# Patient Record
Sex: Male | Born: 1960
Health system: Southern US, Community
[De-identification: ages and names within clinical notes are randomized; demographics above are authoritative.]

## PROBLEM LIST (undated history)

## (undated) DIAGNOSIS — K219 Gastro-esophageal reflux disease without esophagitis: Secondary | ICD-10-CM

## (undated) DIAGNOSIS — E785 Hyperlipidemia, unspecified: Secondary | ICD-10-CM

---

## 2001-09-14 ENCOUNTER — Emergency Department (HOSPITAL_COMMUNITY): Admission: EM | Admit: 2001-09-14 | Discharge: 2001-09-14 | Payer: Self-pay | Admitting: Emergency Medicine

## 2001-09-14 ENCOUNTER — Encounter: Payer: Self-pay | Admitting: Emergency Medicine

## 2009-02-23 ENCOUNTER — Encounter: Admission: RE | Admit: 2009-02-23 | Discharge: 2009-02-23 | Payer: Self-pay | Admitting: Family Medicine

## 2014-05-25 ENCOUNTER — Emergency Department (HOSPITAL_COMMUNITY)
Admission: EM | Admit: 2014-05-25 | Discharge: 2014-05-25 | Disposition: A | Discharge status: Nursing Home (Includes ICF) | Payer: BC Managed Care – PPO | Source: Home / Self Care | Attending: Emergency Medicine | Admitting: Emergency Medicine

## 2014-05-25 ENCOUNTER — Observation Stay (HOSPITAL_COMMUNITY)
Admission: EM | Admit: 2014-05-25 | Discharge: 2014-05-27 | Disposition: A | Discharge status: Home / Self Care | Payer: BC Managed Care – PPO | Attending: General Surgery | Admitting: General Surgery

## 2014-05-25 ENCOUNTER — Encounter (HOSPITAL_COMMUNITY): Payer: Self-pay

## 2014-05-25 ENCOUNTER — Encounter (HOSPITAL_COMMUNITY): Payer: Self-pay | Admitting: Emergency Medicine

## 2014-05-25 DIAGNOSIS — Z7982 Long term (current) use of aspirin: Secondary | ICD-10-CM | POA: Diagnosis not present

## 2014-05-25 DIAGNOSIS — K3580 Unspecified acute appendicitis: Secondary | ICD-10-CM | POA: Diagnosis not present

## 2014-05-25 DIAGNOSIS — E785 Hyperlipidemia, unspecified: Secondary | ICD-10-CM | POA: Diagnosis not present

## 2014-05-25 DIAGNOSIS — K219 Gastro-esophageal reflux disease without esophagitis: Secondary | ICD-10-CM | POA: Insufficient documentation

## 2014-05-25 DIAGNOSIS — R1031 Right lower quadrant pain: Secondary | ICD-10-CM

## 2014-05-25 DIAGNOSIS — R109 Unspecified abdominal pain: Secondary | ICD-10-CM

## 2014-05-25 DIAGNOSIS — Z79899 Other long term (current) drug therapy: Secondary | ICD-10-CM | POA: Insufficient documentation

## 2014-05-25 HISTORY — DX: Hyperlipidemia, unspecified: E78.5

## 2014-05-25 HISTORY — DX: Gastro-esophageal reflux disease without esophagitis: K21.9

## 2014-05-25 LAB — COMPREHENSIVE METABOLIC PANEL
ALBUMIN: 4.3 g/dL (ref 3.5–5.2)
ALK PHOS: 56 U/L (ref 39–117)
ALT: 57 U/L — AB (ref 0–53)
ANION GAP: 15 (ref 5–15)
AST: 35 U/L (ref 0–37)
BUN: 12 mg/dL (ref 6–23)
CO2: 26 mEq/L (ref 19–32)
Calcium: 10 mg/dL (ref 8.4–10.5)
Chloride: 99 mEq/L (ref 96–112)
Creatinine, Ser: 1.03 mg/dL (ref 0.50–1.35)
GFR calc Af Amer: >90 mL/min (ref >90)
GFR calc non Af Amer: 82 mL/min — ABNORMAL LOW (ref >90)
Glucose, Bld: 140 mg/dL — ABNORMAL HIGH (ref 70–99)
POTASSIUM: 4 meq/L (ref 3.7–5.3)
SODIUM: 140 meq/L (ref 137–147)
TOTAL PROTEIN: 7.7 g/dL (ref 6.0–8.3)
Total Bilirubin: 0.8 mg/dL (ref 0.3–1.2)

## 2014-05-25 LAB — CBC WITH DIFFERENTIAL/PLATELET
BASOS PCT: 0 % (ref 0–1)
Basophils Absolute: 0 10*3/uL (ref 0.0–0.1)
Eosinophils Absolute: 0 10*3/uL (ref 0.0–0.7)
Eosinophils Relative: 0 % (ref 0–5)
HCT: 43.1 % (ref 39.0–52.0)
Hemoglobin: 15.3 g/dL (ref 13.0–17.0)
Lymphocytes Relative: 8 % — ABNORMAL LOW (ref 12–46)
Lymphs Abs: 1.1 10*3/uL (ref 0.7–4.0)
MCH: 31.4 pg (ref 26.0–34.0)
MCHC: 35.5 g/dL (ref 30.0–36.0)
MCV: 88.5 fL (ref 78.0–100.0)
Monocytes Absolute: 0.6 10*3/uL (ref 0.1–1.0)
Monocytes Relative: 4 % (ref 3–12)
NEUTROS ABS: 12.7 10*3/uL — AB (ref 1.7–7.7)
NEUTROS PCT: 88 % — AB (ref 43–77)
Platelets: 178 10*3/uL (ref 150–400)
RBC: 4.87 MIL/uL (ref 4.22–5.81)
RDW: 12.6 % (ref 11.5–15.5)
WBC: 14.5 10*3/uL — ABNORMAL HIGH (ref 4.0–10.5)

## 2014-05-25 LAB — URINALYSIS, ROUTINE W REFLEX MICROSCOPIC
Glucose, UA: NEGATIVE mg/dL
HGB URINE DIPSTICK: NEGATIVE
Ketones, ur: 15 mg/dL — AB
Leukocytes, UA: NEGATIVE
Nitrite: NEGATIVE
PH: 5 (ref 5.0–8.0)
Protein, ur: NEGATIVE mg/dL
Specific Gravity, Urine: 1.022 (ref 1.005–1.030)
UROBILINOGEN UA: 0.2 mg/dL (ref 0.0–1.0)

## 2014-05-25 LAB — LIPASE, BLOOD: Lipase: 113 U/L — ABNORMAL HIGH (ref 11–59)

## 2014-05-25 MED ORDER — MORPHINE SULFATE 4 MG/ML IJ SOLN
4.0000 mg | Freq: Once | INTRAMUSCULAR | Status: AC
  Administered 2014-05-25: 4 mg via INTRAVENOUS
  Filled 2014-05-25: qty 1

## 2014-05-25 MED ORDER — HYDROMORPHONE HCL 1 MG/ML IJ SOLN
2.0000 mg | Freq: Once | INTRAMUSCULAR | Status: AC
  Administered 2014-05-25: 2 mg via INTRAMUSCULAR

## 2014-05-25 MED ORDER — SODIUM CHLORIDE 0.9 % IV BOLUS (SEPSIS)
1000.0000 mL | Freq: Once | INTRAVENOUS | Status: AC
  Administered 2014-05-25: 1000 mL via INTRAVENOUS

## 2014-05-25 MED ORDER — ONDANSETRON HCL 4 MG/2ML IJ SOLN
INTRAMUSCULAR | Status: AC
  Filled 2014-05-25: qty 2

## 2014-05-25 MED ORDER — HYDROMORPHONE HCL 1 MG/ML IJ SOLN
INTRAMUSCULAR | Status: AC
  Filled 2014-05-25: qty 2

## 2014-05-25 MED ORDER — SODIUM CHLORIDE 0.9 % IV SOLN
INTRAVENOUS | Status: DC
  Administered 2014-05-25: 21:00:00 via INTRAVENOUS

## 2014-05-25 MED ORDER — ONDANSETRON HCL 4 MG/2ML IJ SOLN
4.0000 mg | Freq: Once | INTRAMUSCULAR | Status: AC
Start: 2014-05-25 — End: 2014-05-25
  Administered 2014-05-25: 4 mg via INTRAMUSCULAR

## 2014-05-25 MED ORDER — HYDROMORPHONE HCL 1 MG/ML IJ SOLN
1.0000 mg | Freq: Once | INTRAMUSCULAR | Status: AC
  Administered 2014-05-25: 1 mg via INTRAVENOUS
  Filled 2014-05-25: qty 1

## 2014-05-25 MED ORDER — ONDANSETRON HCL 4 MG/2ML IJ SOLN
4.0000 mg | Freq: Once | INTRAMUSCULAR | Status: AC
  Administered 2014-05-25: 4 mg via INTRAVENOUS
  Filled 2014-05-25: qty 2

## 2014-05-25 NOTE — ED Notes (Signed)
Per EMS, Patient ate a catered lunch at work and started to have abdominal pain. Patient states the pain has worsened throughout the day. Patient reports nausea, vomiting, generalized pain. Patient went to urgent care for Right sided abdominal pain. [facility] sent patient over to rule out appendicitis. Vitals per EMS: 122/86, 68 HR, 15 RR. Patient given 4 mg of Zofran and [facility] Patient's pain decreased from 8/10 to 2/10.

## 2014-05-25 NOTE — ED Provider Notes (Signed)
CSN: [id number]     Arrival date & time 05/25/14  2109 History   First MD Initiated Contact with Patient 05/25/14 2126     Chief Complaint  Patient presents with  . Abdominal Pain     (Consider location/radiation/quality/duration/timing/severity/associated sxs/prior Treatment) The history is provided by the patient.  Steven Huber is a 53 y.o. male hx of HL, GERD here presenting with abdominal pain. Around lunchtime he started having diffuse abdominal pain. He said it is crampy associated with nausea and vomiting. It progressively radiates to the right lower quadrant. Went to urgent care was sent here for rule out appendicitis. Has some nausea but no vomiting. Some low-grade fever. Denies any urinary symptoms.    Past Medical History  Diagnosis Date  . Hyperlipidemia   . GERD (gastroesophageal reflux disease)    History reviewed. No pertinent past surgical history. No family history on file. History  Substance Use Topics  . Smoking status: Never Smoker   . Smokeless tobacco: Not on file  . Alcohol Use: Yes     Comment: few beers a night at least 4 night a week.    Review of Systems  Gastrointestinal: Positive for nausea and abdominal pain.  All other systems reviewed and are negative.     Allergies  Review of patient's allergies indicates no known allergies.  Home Medications   Prior to Admission medications   Medication Sig Start Date End Date Taking? Authorizing Provider  aspirin EC 81 MG tablet Take 81 mg by mouth daily.   Yes Historical Provider, MD  CRESTOR 10 MG tablet Take 10 mg by mouth daily. 05/14/14  Yes Historical Provider, MD  fluticasone (FLONASE) 50 MCG/ACT nasal spray Place 1 spray into both nostrils daily.   Yes Historical Provider, MD  ibuprofen (ADVIL,MOTRIN) 200 MG tablet Take 400 mg by mouth every 6 (six) hours as needed for mild pain.   Yes Historical Provider, MD  omeprazole (PRILOSEC) 20 MG capsule Take 20 mg by mouth daily.   Yes Historical  Provider, MD   BP 141/83 mmHg  Pulse 71  Temp(Src) 98.6 F (37 C) (Oral)  Resp 14  SpO2 94% Physical Exam  Constitutional: He is oriented to person, place, and time.  Uncomfortable   HENT:  Head: Normocephalic.  MM dry   Eyes: Conjunctivae are normal. Pupils are equal, round, and reactive to light.  Neck: Normal range of motion. Neck supple.  Cardiovascular: Normal rate, regular rhythm and normal heart sounds.   Pulmonary/Chest: Effort normal and breath sounds normal. No respiratory distress. He has no wheezes. He has no rales.  Abdominal: Soft. Bowel sounds are normal.  + diffuse ab tenderness, worse in RLQ. Mild rebound RLQ.   Musculoskeletal: Normal range of motion. He exhibits no edema or tenderness.  Neurological: He is alert and oriented to person, place, and time. No cranial nerve deficit. Coordination normal.  Skin: Skin is warm and dry.  Psychiatric: He has a normal mood and affect. His behavior is normal. Judgment and thought content normal.  Nursing note and vitals reviewed.   ED Course  Procedures (including critical care time) Labs Review Labs Reviewed  CBC WITH DIFFERENTIAL - Abnormal; Notable for the following:    WBC 14.5 (*)    Neutrophils Relative % 88 (*)    Neutro Abs 12.7 (*)    Lymphocytes Relative 8 (*)    All other components within normal limits  COMPREHENSIVE METABOLIC PANEL - Abnormal; Notable for the following:  Glucose, Bld 140 (*)    ALT 57 (*)    GFR calc non Af Amer 82 (*)    All other components within normal limits  LIPASE, BLOOD - Abnormal; Notable for the following:    Lipase 113 (*)    All other components within normal limits  URINALYSIS, ROUTINE W REFLEX MICROSCOPIC - Abnormal; Notable for the following:    Bilirubin Urine SMALL (*)    Ketones, ur 15 (*)    All other components within normal limits    Imaging Review No results found.   EKG Interpretation None      MDM   Final diagnoses:  Abdominal pain    Steven PaliWalter  Di Huber is a 53 y.o. male here with RLQ pain. Concerned for possible appy. Will get labs, CT ab/pel.   11:59 PM Sign out to Dr. Rennis ChrisJacobowitz to f/u CT.     Richardean Canalavid H Yao, MD 05/25/14 [phone]

## 2014-05-25 NOTE — ED Notes (Signed)
Pt states that he ate a cookie and some soup that was in his desk his abdominal pain started after he ate. Since 1400 he has had abdominal pain. Pt did not start vomiting since he entered into the room at 2000.

## 2014-05-25 NOTE — ED Notes (Signed)
Spoke with Steven SquiresEric charge nurse gave background on pt. Let him know pt was coming via EMS.

## 2014-05-25 NOTE — ED Provider Notes (Signed)
  Chief Complaint   Abdominal Pain and Emesis   History of Present Illness   Steven Huber is a 53 year old male who after eating lunch today, around 2 PM the severe, mid abdominal pain. The pain is gradually gotten worse. It's not localized to the right of the left and not radiating through to the back. The pain was worse after eating and worse coming over here going over bumps with a car. He felt nauseated and began to vomit here at the urgent care center. He's felt feverish and chilled and had some sweats. He denies any chest pain or shortness of breath. No urinary symptoms. No diarrhea.  Review of Systems   Other than as noted above, the patient denies any of the following symptoms: Constitutional:  No fever, chills, weight loss or anorexia. Abdomen:  No nausea, vomiting, hematememesis, melena, diarrhea, or hematochezia. GU:  No dysuria, frequency, urgency, or hematuria.  No testicular pain or swelling.  PMFSH   Past medical history, family history, social history, meds, and allergies were reviewed. 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  Physical Examination     Vital signs:  BP 145/88 mmHg  Pulse 68  Temp(Src) 99.3 F (37.4 C) (Oral)  Resp 18  SpO2 95% Gen:  Alert, oriented, and markedly distressed secondary to abdominal pain. Lungs:  Breath sounds clear and equal bilaterally.  No wheezes, rales or rhonchi. Heart:  Regular rhythm.  No gallops or murmers.   Abdomen:  Not distended. No organomegaly or masses. Bowel sounds were not heard. Entire abdomen was very tender to palpation with guarding, most markedly in right lower quadrant. Skin:  Clear, warm and dry.  No rash.  Course in Urgent Care Center   The following medications were given:  Medications  0.9 %  sodium chloride infusion   HYDROmorphone (DILAUDID) injection 2 mg   ondansetron (ZOFRAN) injection 4 mg    Assessment   The encounter diagnosis was Right lower quadrant pain.  Appendicitis versus  diverticulitis.  Plan     The patient was transferred to the ED via EMS in stable condition.  Medical Decision Making:  53 year old male has a 6 hour history of severe, generalized abdominal pain with nausea and vomiting.  Associated with subjective fever, chills, and sweats.  On exam he has generized tenderness most severe in RLQ with guarding and rebound.  Bowel sounds are absent.  We have started IV NS and give IM Dilaudid and Zofran.  I suspect appendicitis.        Reuben Likesavid C Randie Bloodgood, MD 05/25/14 [phone]

## 2014-05-25 NOTE — Discharge Instructions (Signed)
We have determined that your problem requires further evaluation in the emergency department.  We will take care of your transport there.  Once at the emergency department, you will be evaluated by a provider and they will order whatever treatment or tests they deem necessary.  We cannot guarantee that they will do any specific test or do any specific treatment.  ° °

## 2014-05-25 NOTE — ED Notes (Signed)
Informed CT patient is done with contrast 

## 2014-05-26 ENCOUNTER — Encounter (HOSPITAL_COMMUNITY)
Admission: EM | Disposition: A | Discharge status: Home / Self Care | Payer: Self-pay | Source: Home / Self Care | Attending: Emergency Medicine

## 2014-05-26 ENCOUNTER — Observation Stay (HOSPITAL_COMMUNITY): Payer: BC Managed Care – PPO | Admitting: Anesthesiology

## 2014-05-26 ENCOUNTER — Encounter (HOSPITAL_COMMUNITY): Payer: Self-pay | Admitting: Radiology

## 2014-05-26 ENCOUNTER — Emergency Department (HOSPITAL_COMMUNITY): Payer: BC Managed Care – PPO

## 2014-05-26 DIAGNOSIS — K3580 Unspecified acute appendicitis: Secondary | ICD-10-CM | POA: Diagnosis present

## 2014-05-26 HISTORY — PX: LAPAROSCOPIC APPENDECTOMY: SHX408

## 2014-05-26 LAB — SURGICAL PCR SCREEN
MRSA, PCR: NEGATIVE
Staphylococcus aureus: POSITIVE — AB

## 2014-05-26 SURGERY — APPENDECTOMY, LAPAROSCOPIC
Anesthesia: General | Site: Abdomen

## 2014-05-26 MED ORDER — FENTANYL CITRATE 0.05 MG/ML IJ SOLN
INTRAMUSCULAR | Status: AC
  Filled 2014-05-26: qty 5

## 2014-05-26 MED ORDER — SUCCINYLCHOLINE CHLORIDE 20 MG/ML IJ SOLN
INTRAMUSCULAR | Status: DC | PRN
  Administered 2014-05-26: 120 mg via INTRAVENOUS

## 2014-05-26 MED ORDER — KCL IN DEXTROSE-NACL 20-5-0.45 MEQ/L-%-% IV SOLN
INTRAVENOUS | Status: DC
  Administered 2014-05-26 – 2014-05-27 (×2): via INTRAVENOUS
  Filled 2014-05-26 (×4): qty 1000

## 2014-05-26 MED ORDER — ONDANSETRON HCL 4 MG/2ML IJ SOLN: 4.0000 mg | Freq: Four times a day (QID) | INTRAMUSCULAR | Status: DC | PRN

## 2014-05-26 MED ORDER — PROPOFOL 10 MG/ML IV BOLUS
INTRAVENOUS | Status: AC
  Filled 2014-05-26: qty 20

## 2014-05-26 MED ORDER — PROPOFOL 10 MG/ML IV BOLUS
INTRAVENOUS | Status: DC | PRN
  Administered 2014-05-26: 200 mg via INTRAVENOUS

## 2014-05-26 MED ORDER — MUPIROCIN 2 % EX OINT
TOPICAL_OINTMENT | CUTANEOUS | Status: AC
  Filled 2014-05-26: qty 22

## 2014-05-26 MED ORDER — OXYCODONE-ACETAMINOPHEN 5-325 MG PO TABS
1.0000 | ORAL_TABLET | ORAL | Status: DC | PRN
  Administered 2014-05-26 – 2014-05-27 (×3): 2 via ORAL
  Filled 2014-05-26 (×3): qty 2

## 2014-05-26 MED ORDER — SCOPOLAMINE 1 MG/3DAYS TD PT72
MEDICATED_PATCH | TRANSDERMAL | Status: DC | PRN
  Administered 2014-05-26: 1 via TRANSDERMAL

## 2014-05-26 MED ORDER — ONDANSETRON HCL 4 MG PO TABS: 4.0000 mg | ORAL_TABLET | Freq: Four times a day (QID) | ORAL | Status: DC | PRN

## 2014-05-26 MED ORDER — FLUTICASONE PROPIONATE 50 MCG/ACT NA SUSP
1.0000 | Freq: Every day | NASAL | Status: DC
  Administered 2014-05-27: 1 via NASAL
  Filled 2014-05-26: qty 16

## 2014-05-26 MED ORDER — ENOXAPARIN SODIUM 40 MG/0.4ML ~~LOC~~ SOLN
40.0000 mg | SUBCUTANEOUS | Status: DC
  Administered 2014-05-27: 40 mg via SUBCUTANEOUS
  Filled 2014-05-26 (×2): qty 0.4

## 2014-05-26 MED ORDER — ONDANSETRON HCL 4 MG/2ML IJ SOLN
INTRAMUSCULAR | Status: DC | PRN
  Administered 2014-05-26: 4 mg via INTRAVENOUS

## 2014-05-26 MED ORDER — MIDAZOLAM HCL 5 MG/5ML IJ SOLN
INTRAMUSCULAR | Status: DC | PRN
  Administered 2014-05-26: 2 mg via INTRAVENOUS

## 2014-05-26 MED ORDER — DIPHENHYDRAMINE HCL 50 MG/ML IJ SOLN
INTRAMUSCULAR | Status: AC
  Filled 2014-05-26: qty 1

## 2014-05-26 MED ORDER — BUPIVACAINE-EPINEPHRINE 0.25% -1:200000 IJ SOLN
INTRAMUSCULAR | Status: DC | PRN
  Administered 2014-05-26: 30 mL

## 2014-05-26 MED ORDER — LIDOCAINE HCL (CARDIAC) 20 MG/ML IV SOLN
INTRAVENOUS | Status: DC | PRN
  Administered 2014-05-26: 10 mg via INTRAVENOUS

## 2014-05-26 MED ORDER — SODIUM CHLORIDE 0.9 % IR SOLN
Status: DC | PRN
  Administered 2014-05-26: 1000 mL

## 2014-05-26 MED ORDER — HYDROMORPHONE HCL 1 MG/ML IJ SOLN
1.0000 mg | INTRAMUSCULAR | Status: DC | PRN
  Administered 2014-05-26: 1 mg via INTRAVENOUS
  Filled 2014-05-26: qty 1

## 2014-05-26 MED ORDER — DIPHENHYDRAMINE HCL 50 MG/ML IJ SOLN
INTRAMUSCULAR | Status: DC | PRN
  Administered 2014-05-26: 10 mg via INTRAVENOUS

## 2014-05-26 MED ORDER — 0.9 % SODIUM CHLORIDE (POUR BTL) OPTIME
TOPICAL | Status: DC | PRN
  Administered 2014-05-26: 1000 mL

## 2014-05-26 MED ORDER — GLYCOPYRROLATE 0.2 MG/ML IJ SOLN
INTRAMUSCULAR | Status: AC
  Filled 2014-05-26: qty 4

## 2014-05-26 MED ORDER — SUCCINYLCHOLINE CHLORIDE 20 MG/ML IJ SOLN
INTRAMUSCULAR | Status: AC
  Filled 2014-05-26: qty 2

## 2014-05-26 MED ORDER — CEFTRIAXONE SODIUM IN DEXTROSE 20 MG/ML IV SOLN
1.0000 g | INTRAVENOUS | Status: DC
  Administered 2014-05-26 – 2014-05-27 (×2): 1 g via INTRAVENOUS
  Filled 2014-05-26 (×2): qty 50

## 2014-05-26 MED ORDER — LACTATED RINGERS IV SOLN: INTRAVENOUS | Status: DC

## 2014-05-26 MED ORDER — MUPIROCIN 2 % EX OINT
1.0000 "application " | TOPICAL_OINTMENT | Freq: Once | CUTANEOUS | Status: AC
  Administered 2014-05-26: 1 via TOPICAL

## 2014-05-26 MED ORDER — ACETAMINOPHEN 325 MG PO TABS: 650.0000 mg | ORAL_TABLET | ORAL | Status: DC | PRN

## 2014-05-26 MED ORDER — PANTOPRAZOLE SODIUM 40 MG IV SOLR
40.0000 mg | Freq: Every day | INTRAVENOUS | Status: DC
Start: 2014-05-26 — End: 2014-05-27
  Administered 2014-05-26: 40 mg via INTRAVENOUS
  Filled 2014-05-26 (×2): qty 40

## 2014-05-26 MED ORDER — SODIUM CHLORIDE 0.9 % IV SOLN
INTRAVENOUS | Status: DC
  Administered 2014-05-26: 03:00:00 via INTRAVENOUS

## 2014-05-26 MED ORDER — SODIUM CHLORIDE 0.9 % IJ SOLN
INTRAMUSCULAR | Status: AC
  Filled 2014-05-26: qty 20

## 2014-05-26 MED ORDER — VECURONIUM BROMIDE 10 MG IV SOLR
INTRAVENOUS | Status: AC
  Filled 2014-05-26: qty 20

## 2014-05-26 MED ORDER — INFLUENZA VAC SPLIT QUAD 0.5 ML IM SUSY
0.5000 mL | PREFILLED_SYRINGE | INTRAMUSCULAR | Status: AC | PRN
  Administered 2014-05-27: 0.5 mL via INTRAMUSCULAR

## 2014-05-26 MED ORDER — METRONIDAZOLE IN NACL 5-0.79 MG/ML-% IV SOLN
500.0000 mg | Freq: Three times a day (TID) | INTRAVENOUS | Status: DC
  Administered 2014-05-26 – 2014-05-27 (×4): 500 mg via INTRAVENOUS
  Filled 2014-05-26 (×6): qty 100

## 2014-05-26 MED ORDER — LIDOCAINE HCL (CARDIAC) 20 MG/ML IV SOLN
INTRAVENOUS | Status: AC
  Filled 2014-05-26: qty 10

## 2014-05-26 MED ORDER — VECURONIUM BROMIDE 10 MG IV SOLR
INTRAVENOUS | Status: DC | PRN
  Administered 2014-05-26: 2 mg via INTRAVENOUS
  Administered 2014-05-26: 1 mg via INTRAVENOUS

## 2014-05-26 MED ORDER — FENTANYL CITRATE 0.05 MG/ML IJ SOLN
INTRAMUSCULAR | Status: DC | PRN
  Administered 2014-05-26: 100 ug via INTRAVENOUS
  Administered 2014-05-26: 50 ug via INTRAVENOUS
  Administered 2014-05-26: 100 ug via INTRAVENOUS

## 2014-05-26 MED ORDER — NEOSTIGMINE METHYLSULFATE 10 MG/10ML IV SOLN
INTRAVENOUS | Status: AC
  Filled 2014-05-26: qty 1

## 2014-05-26 MED ORDER — IOHEXOL 300 MG/ML  SOLN
100.0000 mL | Freq: Once | INTRAMUSCULAR | Status: AC | PRN
  Administered 2014-05-26: 100 mL via INTRAVENOUS

## 2014-05-26 MED ORDER — MORPHINE SULFATE 4 MG/ML IJ SOLN
4.0000 mg | Freq: Once | INTRAMUSCULAR | Status: AC
  Administered 2014-05-26: 4 mg via INTRAVENOUS
  Filled 2014-05-26: qty 1

## 2014-05-26 MED ORDER — GLYCOPYRROLATE 0.2 MG/ML IJ SOLN
INTRAMUSCULAR | Status: DC | PRN
  Administered 2014-05-26: 0.4 mg via INTRAVENOUS

## 2014-05-26 MED ORDER — NEOSTIGMINE METHYLSULFATE 10 MG/10ML IV SOLN
INTRAVENOUS | Status: DC | PRN
  Administered 2014-05-26: 3 mg via INTRAVENOUS

## 2014-05-26 MED ORDER — MORPHINE SULFATE 2 MG/ML IJ SOLN
2.0000 mg | INTRAMUSCULAR | Status: DC | PRN
  Administered 2014-05-26: 2 mg via INTRAVENOUS
  Filled 2014-05-26: qty 1

## 2014-05-26 MED ORDER — MIDAZOLAM HCL 2 MG/2ML IJ SOLN
INTRAMUSCULAR | Status: AC
  Filled 2014-05-26: qty 2

## 2014-05-26 MED ORDER — ONDANSETRON HCL 4 MG/2ML IJ SOLN
INTRAMUSCULAR | Status: AC
  Filled 2014-05-26: qty 4

## 2014-05-26 MED ORDER — SCOPOLAMINE 1 MG/3DAYS TD PT72
MEDICATED_PATCH | TRANSDERMAL | Status: AC
  Filled 2014-05-26: qty 1

## 2014-05-26 MED ORDER — LACTATED RINGERS IV SOLN
INTRAVENOUS | Status: DC | PRN
  Administered 2014-05-26: 09:00:00 via INTRAVENOUS

## 2014-05-26 MED ORDER — BUPIVACAINE-EPINEPHRINE (PF) 0.25% -1:200000 IJ SOLN
INTRAMUSCULAR | Status: AC
  Filled 2014-05-26: qty 30

## 2014-05-26 SURGICAL SUPPLY — 46 items
APL SKNCLS STERI-STRIP NONHPOA (GAUZE/BANDAGES/DRESSINGS) ×1
APPLIER CLIP ROT 10 11.4 M/L (STAPLE) ×3
APR CLP MED LRG 11.4X10 (STAPLE) ×1
BAG SPEC RTRVL LRG 6X4 10 (ENDOMECHANICALS) ×1
BENZOIN TINCTURE PRP APPL 2/3 (GAUZE/BANDAGES/DRESSINGS) ×3 IMPLANT
BLADE SURG ROTATE 9660 (MISCELLANEOUS) ×2 IMPLANT
CANISTER SUCTION 2500CC (MISCELLANEOUS) ×3 IMPLANT
CHLORAPREP W/TINT 26ML (MISCELLANEOUS) ×3 IMPLANT
CLIP APPLIE ROT 10 11.4 M/L (STAPLE) IMPLANT
CLOSURE WOUND 1/2 X4 (GAUZE/BANDAGES/DRESSINGS) ×1
COVER SURGICAL LIGHT HANDLE (MISCELLANEOUS) ×3 IMPLANT
CUTTER FLEX LINEAR 45M (STAPLE) ×3 IMPLANT
DRAPE LAPAROSCOPIC ABDOMINAL (DRAPES) ×3 IMPLANT
DRAPE UTILITY 15X26 W/TAPE STR (DRAPE) ×2 IMPLANT
DRSG TEGADERM 2-3/8X2-3/4 SM (GAUZE/BANDAGES/DRESSINGS) ×4 IMPLANT
DRSG TEGADERM 4X4.75 (GAUZE/BANDAGES/DRESSINGS) ×3 IMPLANT
ELECT REM PT RETURN 9FT ADLT (ELECTROSURGICAL) ×3
ELECTRODE REM PT RTRN 9FT ADLT (ELECTROSURGICAL) ×1 IMPLANT
ENDOLOOP SUT PDS II  0 18 (SUTURE)
ENDOLOOP SUT PDS II 0 18 (SUTURE) IMPLANT
FILTER SMOKE EVAC LAPAROSHD (FILTER) ×3 IMPLANT
GAUZE SPONGE 2X2 8PLY STRL LF (GAUZE/BANDAGES/DRESSINGS) ×1 IMPLANT
GLOVE BIO SURGEON STRL SZ7 (GLOVE) ×3 IMPLANT
GLOVE BIOGEL PI IND STRL 7.5 (GLOVE) ×1 IMPLANT
GLOVE BIOGEL PI INDICATOR 7.5 (GLOVE) ×2
GOWN STRL REUS W/ TWL LRG LVL3 (GOWN DISPOSABLE) ×3 IMPLANT
GOWN STRL REUS W/TWL LRG LVL3 (GOWN DISPOSABLE) ×9
KIT BASIN OR (CUSTOM PROCEDURE TRAY) ×3 IMPLANT
KIT ROOM TURNOVER OR (KITS) ×3 IMPLANT
NS IRRIG 1000ML POUR BTL (IV SOLUTION) ×3 IMPLANT
PAD ARMBOARD 7.5X6 YLW CONV (MISCELLANEOUS) ×4 IMPLANT
POUCH SPECIMEN RETRIEVAL 10MM (ENDOMECHANICALS) ×3 IMPLANT
RELOAD STAPLE 45 3.5 BLU ETS (ENDOMECHANICALS) ×1 IMPLANT
RELOAD STAPLE TA45 3.5 REG BLU (ENDOMECHANICALS) ×3 IMPLANT
SCALPEL HARMONIC ACE (MISCELLANEOUS) ×3 IMPLANT
SET IRRIG TUBING LAPAROSCOPIC (IRRIGATION / IRRIGATOR) ×3 IMPLANT
SPECIMEN JAR SMALL (MISCELLANEOUS) ×3 IMPLANT
SPONGE GAUZE 2X2 STER 10/PKG (GAUZE/BANDAGES/DRESSINGS) ×2
STRIP CLOSURE SKIN 1/2X4 (GAUZE/BANDAGES/DRESSINGS) ×1 IMPLANT
SUT MNCRL AB 4-0 PS2 18 (SUTURE) ×3 IMPLANT
TOWEL OR 17X24 6PK STRL BLUE (TOWEL DISPOSABLE) ×1 IMPLANT
TOWEL OR 17X26 10 PK STRL BLUE (TOWEL DISPOSABLE) ×3 IMPLANT
TRAY LAPAROSCOPIC (CUSTOM PROCEDURE TRAY) ×3 IMPLANT
TROCAR XCEL BLADELESS 5X75MML (TROCAR) ×6 IMPLANT
TROCAR XCEL BLUNT TIP 100MML (ENDOMECHANICALS) ×3 IMPLANT
TUBING INSUFFLATION (TUBING) ×3 IMPLANT

## 2014-05-26 NOTE — Op Note (Signed)
Appendectomy, Lap, Procedure Note  Indications: The patient presented with a history of right-sided abdominal pain. A CT scan revealed findings consistent with acute appendicitis.  Pre-operative Diagnosis: Acute appendicitis without mention of peritonitis  Post-operative Diagnosis: Same  Surgeon: Freeman Borba K.   Assistants: none  Anesthesia: General endotracheal anesthesia  ASA Class: 2E  Procedure Details  The patient was seen again in the Holding Room. The risks, benefits, complications, treatment options, and expected outcomes were discussed with the patient and/or family. The possibilities of reaction to medication, perforation of viscus, bleeding, recurrent infection, finding a normal appendix, the need for additional procedures, failure to diagnose a condition, and creating a complication requiring transfusion or operation were discussed. There was concurrence with the proposed plan and informed consent was obtained. The site of surgery was properly noted. The patient was taken to Operating Room, identified as Steven Huber and the procedure verified as Appendectomy. A Time Out was held and the above information confirmed.  The patient was placed in the supine position and general anesthesia was induced.  The abdomen was prepped and draped in a sterile fashion. A one centimeter infraumbilical incision was made.  Dissection was carried down to the fascia bluntly.  The fascia was incised vertically.  We entered the peritoneal cavity bluntly.  A pursestring suture was passed around the incision with a 0 Vicryl.  The Hasson cannula was introduced into the abdomen and the tails of the suture were used to hold the Hasson in place.   The pneumoperitoneum was then established maintaining a maximum pressure of 15 mmHg.  Additional 5 mm cannulas then placed in the left lower quadrant of the abdomen and the right upper quadrant under direct visualization. A careful evaluation of the entire  abdomen was carried out. The patient was placed in Trendelenburg and left lateral decubitus position.  The scope was moved to the right upper quadrant port site. The cecum was mobilized medially.  The appendix was identified in the right paracolic gutter.  The appendix was very inflamed and thickened, but there was no sign of perforation.  The appendix was carefully dissected. The appendix was was skeletonized with the harmonic scalpel and LigaClips.   The appendix was divided at its base using an endo-GIA stapler. Minimal appendiceal stump was left in place. There was no evidence of bleeding, leakage, or complication after division of the appendix. Irrigation was also performed and irrigate suctioned from the abdomen as well.  The umbilical port site was closed with the purse string suture. There was no residual palpable fascial defect.  The trocar site skin wounds were closed with 4-0 Monocryl.  Instrument, sponge, and needle counts were correct at the conclusion of the case.   Findings: The appendix was found to be inflamed. There were signs of necrosis.  There was not perforation. There was not abscess formation.  Estimated Blood Loss:  Less than 50 ml         Drains: none         Specimens: appendix         Complications:  None; patient tolerated the procedure well.         Disposition: PACU - hemodynamically stable.         Condition: stable  Steven ArmsMatthew K. Corliss Skainssuei, MD, [facility]  General/ Trauma Surgery  05/26/2014 9:57 AM

## 2014-05-26 NOTE — ED Provider Notes (Signed)
Complains of right lower quadrant pain onset approximately 2 PM yesterday. Patient had 2 episodes of vomiting since onset of pain. He last ate 12 noon yesterday. Past medical history is negative. Patient alert nontoxic. Presently requesting pain medicine. Morphine ordered General surgery consult called to evaluate patient for admission and surgery Results for orders placed or performed during the hospital encounter of 05/25/14  CBC with Differential  Result Value Ref Range   WBC 14.5 (H) 4.0 - 10.5 K/uL   RBC 4.87 4.22 - 5.81 MIL/uL   Hemoglobin 15.3 13.0 - 17.0 g/dL   HCT 16.143.1 09.639.0 - 04.552.0 %   MCV 88.5 78.0 - 100.0 fL   MCH 31.4 26.0 - 34.0 pg   MCHC 35.5 30.0 - 36.0 g/dL   RDW 40.912.6 81.111.5 - 91.415.5 %   Platelets 178 150 - 400 K/uL   Neutrophils Relative % 88 (H) 43 - 77 %   Neutro Abs 12.7 (H) 1.7 - 7.7 K/uL   Lymphocytes Relative 8 (L) 12 - 46 %   Lymphs Abs 1.1 0.7 - 4.0 K/uL   Monocytes Relative 4 3 - 12 %   Monocytes Absolute 0.6 0.1 - 1.0 K/uL   Eosinophils Relative 0 0 - 5 %   Eosinophils Absolute 0.0 0.0 - 0.7 K/uL   Basophils Relative 0 0 - 1 %   Basophils Absolute 0.0 0.0 - 0.1 K/uL  Comprehensive metabolic panel  Result Value Ref Range   Sodium 140 137 - 147 mEq/L   Potassium 4.0 3.7 - 5.3 mEq/L   Chloride 99 96 - 112 mEq/L   CO2 26 19 - 32 mEq/L   Glucose, Bld 140 (H) 70 - 99 mg/dL   BUN 12 6 - 23 mg/dL   Creatinine, Ser 7.821.03 0.50 - 1.35 mg/dL   Calcium 95.610.0 8.4 - 21.310.5 mg/dL   Total Protein 7.7 6.0 - 8.3 g/dL   Albumin 4.3 3.5 - 5.2 g/dL   AST 35 0 - 37 U/L   ALT 57 (H) 0 - 53 U/L   Alkaline Phosphatase 56 39 - 117 U/L   Total Bilirubin 0.8 0.3 - 1.2 mg/dL   GFR calc non Af Amer 82 (L) >90 mL/min   GFR calc Af Amer >90 >90 mL/min   Anion gap 15 5 - 15  Lipase, blood  Result Value Ref Range   Lipase 113 (H) 11 - 59 U/L  Urinalysis, Routine w reflex microscopic  Result Value Ref Range   Color, Urine YELLOW YELLOW   APPearance CLEAR CLEAR   Specific Gravity,  Urine 1.022 1.005 - 1.030   pH 5.0 5.0 - 8.0   Glucose, UA NEGATIVE NEGATIVE mg/dL   Hgb urine dipstick NEGATIVE NEGATIVE   Bilirubin Urine SMALL (A) NEGATIVE   Ketones, ur 15 (A) NEGATIVE mg/dL   Protein, ur NEGATIVE NEGATIVE mg/dL   Urobilinogen, UA 0.2 0.0 - 1.0 mg/dL   Nitrite NEGATIVE NEGATIVE   Leukocytes, UA NEGATIVE NEGATIVE   Ct Abdomen Pelvis W Contrast  05/26/2014   CLINICAL DATA:  Periumbilical and right lower abdominal pain with nausea and vomiting for 1 day.  EXAM: CT ABDOMEN AND PELVIS WITH CONTRAST  TECHNIQUE: Multidetector CT imaging of the abdomen and pelvis was performed using the standard protocol following bolus administration of intravenous contrast.  CONTRAST:  100mL OMNIPAQUE IOHEXOL 300 MG/ML  SOLN  COMPARISON:  None.  FINDINGS: Lung bases are clear. Small esophageal hiatal hernia. Residual contrast material in the lower esophagus may represent dysmotility or  reflux.  Diffuse fatty infiltration of the liver. The gallbladder, spleen, pancreas, adrenal glands, kidneys, abdominal aorta, inferior vena cava, and retroperitoneal lymph nodes are unremarkable. Stomach, small bowel, and colon are decompressed. No free air or free fluid in the abdomen.  Pelvis: There is fluid-filled distention of the appendix with maximal diameter measuring about 13 mm. Several at appendicolith sore present in the base of the appendix. There is mild inflammatory stranding around the appendix. Changes are consistent with early acute appendicitis. No abscess is demonstrated.  Prostate gland is enlarged, measuring 5.4 x 4.2 cm. Bladder wall is not thickened. No free or loculated pelvic fluid collections. No pelvic mass or lymphadenopathy. No destructive bone lesions.  IMPRESSION: Changes of early acute appendicitis with an appendicolith. No abscess. Prostate enlargement. Fatty infiltration of the liver. Esophageal hiatal hernia with contrast material in the lower esophagus.   Electronically Signed   By:  Burman NievesWilliam  Stevens M.D.   On: 05/26/2014 01:24   Dx #1 acute appendicitis #2 hyperglycemia  3:15 AM pain improved after treatment with morphine. Spoke with Dr. Janee Mornhompson who will arrange for admission and surgery  Doug SouSam Sandy Blouch, MD 05/26/14 [phone]

## 2014-05-26 NOTE — Progress Notes (Signed)
UR completed 

## 2014-05-26 NOTE — Anesthesia Procedure Notes (Signed)
Procedure Name: Intubation Date/Time: 05/26/2014 8:59 AM Performed by: Arlice ColtMANESS, Jullia Mulligan B Pre-anesthesia Checklist: Patient identified, Emergency Drugs available, Suction available, Patient being monitored and Timeout performed Patient Re-evaluated:Patient Re-evaluated prior to inductionOxygen Delivery Method: Circle system utilized Preoxygenation: Pre-oxygenation with 100% oxygen Intubation Type: IV induction and Rapid sequence Grade View: Grade I Tube type: Oral Tube size: 7.5 mm Number of attempts: 1 Airway Equipment and Method: Stylet and Video-laryngoscopy (elective glidescope intubation by Conor    EMT student) Placement Confirmation: ETT inserted through vocal cords under direct vision,  positive ETCO2 and breath sounds checked- equal and bilateral Secured at: 22 cm Tube secured with: Tape Dental Injury: Teeth and Oropharynx as per pre-operative assessment

## 2014-05-26 NOTE — Transfer of Care (Signed)
Immediate Anesthesia Transfer of Care Note  [facility]  Procedure(s) Performed: Procedure(s): APPENDECTOMY LAPAROSCOPIC (N/A)  Patient Location: PACU  Anesthesia Type:General  Level of Consciousness: awake, alert  and oriented  Airway & Oxygen Therapy: Patient Spontanous Breathing  Post-op Assessment: Report given to PACU RN and Post -op Vital signs reviewed and stable  Post vital signs: Reviewed and stable  Complications: No apparent anesthesia complications

## 2014-05-26 NOTE — Progress Notes (Signed)
I have seen the patient and discussed the procedure with him and his wife.  Questions were answered.  Will proceed with laparoscopic appendectomy this morning.  The surgical procedure has been discussed with the patient.  Potential risks, benefits, alternative treatments, and expected outcomes have been explained.  All of the patient's questions at this time have been answered.  The likelihood of reaching the patient's treatment goal is good.  The patient understand the proposed surgical procedure and wishes to proceed.   Steven ArmsMatthew K. Corliss Skainssuei, MD, [facility]  General/ Trauma Surgery  05/26/2014 7:52 AM

## 2014-05-26 NOTE — Discharge Instructions (Signed)
[facility] ______[facility] °LAPAROSCOPIC SURGERY: POST OP INSTRUCTIONS °Always review your discharge instruction sheet given to you by the facility where your surgery was performed. °IF YOU HAVE DISABILITY OR FAMILY LEAVE FORMS, YOU MUST BRING THEM TO THE OFFICE FOR PROCESSING.   °DO NOT GIVE THEM TO YOUR DOCTOR. ° °1. A prescription for pain medication may be given to you upon discharge.  Take your pain medication as prescribed, if needed.  If narcotic pain medicine is not needed, then you may take acetaminophen (Tylenol) or ibuprofen (Advil) as needed. °2. Take your usually prescribed medications unless otherwise directed. °3. If you need a refill on your pain medication, please contact your pharmacy.  They will contact our office to request authorization. Prescriptions will not be filled after 5pm or on week-ends. °4. You should follow a light diet the first few days after arrival home, such as soup and crackers, etc.  Be sure to include lots of fluids daily. °5. Most patients will experience some swelling and bruising in the area of the incisions.  Ice packs will help.  Swelling and bruising can take several days to resolve.  °6. It is common to experience some constipation if taking pain medication after surgery.  Increasing fluid intake and taking a stool softener (such as Colace) will usually help or prevent this problem from occurring.  A mild laxative (Milk of Magnesia or Miralax) should be taken according to package instructions if there are no bowel movements after 48 hours. °7. Unless discharge instructions indicate otherwise, you may remove your bandages 24-48 hours after surgery, and you may shower at that time.  You may have steri-strips (small skin tapes) in place directly over the incision.  These strips should be left on the skin for 7-10 days.  If your surgeon used skin glue on the incision, you may shower in 24 hours.  The glue will flake off over the next 2-3 weeks.  Any sutures or  staples will be removed at the office during your follow-up visit. °8. ACTIVITIES:  You may resume regular (light) daily activities beginning the next day--such as daily self-care, walking, climbing stairs--gradually increasing activities as tolerated.  You may have sexual intercourse when it is comfortable.  Refrain from any heavy lifting or straining until approved by your doctor. °a. You may drive when you are no longer taking prescription pain medication, you can comfortably wear a seatbelt, and you can safely maneuver your car and apply brakes. °b. RETURN TO WORK:  __________________________________________________________ °9. You should see your doctor in the office for a follow-up appointment approximately 2-3 weeks after your surgery.  Make sure that you call for this appointment within a day or two after you arrive home to insure a convenient appointment time. °10. OTHER INSTRUCTIONS: __________________________________________________________________________________________________________________________ __________________________________________________________________________________________________________________________ °WHEN TO CALL YOUR DOCTOR: °1. Fever over 101.0 °2. Inability to urinate °3. Continued bleeding from incision. °4. Increased pain, redness, or drainage from the incision. °5. Increasing abdominal pain ° °The clinic staff is available to answer your questions during regular business hours.  Please don’t hesitate to call and ask to speak to one of the nurses for clinical concerns.  If you have a medical emergency, go to the nearest emergency room or call 911.  A surgeon from [facility] is always on call at the hospital. °[address], [address], [place], [place]  [address] ? [address] [address], [place], [place]   [address] °[phone] ? [phone] ? FAX [phone] °Web site:   [email or web address] °

## 2014-05-26 NOTE — Anesthesia Postprocedure Evaluation (Signed)
  Anesthesia Post-op Note  Patient: Steven Huber  Procedure(s) Performed: Procedure(s): APPENDECTOMY LAPAROSCOPIC (N/A)  Patient Location: PACU  Anesthesia Type:General  Level of Consciousness: awake, alert , oriented and patient cooperative  Airway and Oxygen Therapy: Patient Spontanous Breathing  Post-op Pain: none  Post-op Assessment: Post-op Vital signs reviewed, Patient's Cardiovascular Status Stable, Respiratory Function Stable, Patent Airway, No signs of Nausea or vomiting and Pain level controlled  Post-op Vital Signs: Reviewed and stable  Last Vitals:  Filed Vitals:   05/26/14 1030  BP: 132/58  Pulse: 79  Temp: 36.6 C  Resp: 13    Complications: No apparent anesthesia complications

## 2014-05-26 NOTE — ED Notes (Signed)
Patient transported to CT 

## 2014-05-26 NOTE — Anesthesia Preprocedure Evaluation (Addendum)
Anesthesia Evaluation  Patient identified by MRN, date of birth, ID band Patient awake    Reviewed: Allergy & Precautions, H&P , NPO status , Patient's Chart, lab work & pertinent test results  History of Anesthesia Complications Negative for: history of anesthetic complications  Airway Mallampati: III  TM Distance: <3 FB Neck ROM: Full    Dental  (+) Teeth Intact, Dental Advisory Given   Pulmonary neg pulmonary ROS,  breath sounds clear to auscultation        Cardiovascular negative cardio ROS  Rhythm:Regular Rate:Normal     Neuro/Psych negative neurological ROS     GI/Hepatic Neg liver ROS, GERD-  Medicated and Controlled,N/v with acute appy   Endo/Other  negative endocrine ROS  Renal/GU negative Renal ROS     Musculoskeletal   Abdominal   Peds  Hematology   Anesthesia Other Findings   Reproductive/Obstetrics                           Anesthesia Physical Anesthesia Plan  ASA: II  Anesthesia Plan: General   Post-op Pain Management:    Induction: Intravenous and Rapid sequence  Airway Management Planned: Oral ETT and Video Laryngoscope Planned  Additional Equipment:   Intra-op Plan:   Post-operative Plan: Extubation in OR  Informed Consent: I have reviewed the patients History and Physical, chart, labs and discussed the procedure including the risks, benefits and alternatives for the proposed anesthesia with the patient or authorized representative who has indicated his/her understanding and acceptance.   Dental advisory given  Plan Discussed with: Anesthesiologist, Surgeon and CRNA  Anesthesia Plan Comments: (Plan routine monitors, GETA with rapid sequence and VideoGlide intubation  )       Anesthesia Quick Evaluation

## 2014-05-26 NOTE — H&P (Signed)
Steven Huber is an 53 y.o. male.   Chief Complaint: Right lower quadrant abdominal pain, nausea and vomiting HPI: Steven Huber developed generalized abdominal pain yesterday approximately 1 PM. He had associated nausea and vomiting. The pain gradually localized more to the right side of his abdomen. He came to the emergency department for evaluation. Workup included laboratory studies demonstrated leukocytosis of 14,500. CT scan of the abdomen and pelvis was done which demonstrates appendicolith and early acute appendicitis. He continues to have some pain.  Past Medical History  Diagnosis Date  . Hyperlipidemia   . GERD (gastroesophageal reflux disease)     History reviewed. No pertinent past surgical history.  No family history on file. Social History:  reports that he has never smoked. He does not have any smokeless tobacco history on file. He reports that he drinks alcohol. He reports that he does not use illicit drugs.  Allergies: No Known Allergies   (Not in a hospital admission)  Results for orders placed or performed during the hospital encounter of 05/25/14 (from the past 48 hour(s))  CBC with Differential     Status: Abnormal   Collection Time: 05/25/14  9:21 PM  Result Value Ref Range   WBC 14.5 (H) 4.0 - 10.5 K/uL   RBC 4.87 4.22 - 5.81 MIL/uL   Hemoglobin 15.3 13.0 - 17.0 g/dL   HCT 43.1 39.0 - 52.0 %   MCV 88.5 78.0 - 100.0 fL   MCH 31.4 26.0 - 34.0 pg   MCHC 35.5 30.0 - 36.0 g/dL   RDW 12.6 11.5 - 15.5 %   Platelets 178 150 - 400 K/uL   Neutrophils Relative % 88 (H) 43 - 77 %   Neutro Abs 12.7 (H) 1.7 - 7.7 K/uL   Lymphocytes Relative 8 (L) 12 - 46 %   Lymphs Abs 1.1 0.7 - 4.0 K/uL   Monocytes Relative 4 3 - 12 %   Monocytes Absolute 0.6 0.1 - 1.0 K/uL   Eosinophils Relative 0 0 - 5 %   Eosinophils Absolute 0.0 0.0 - 0.7 K/uL   Basophils Relative 0 0 - 1 %   Basophils Absolute 0.0 0.0 - 0.1 K/uL  Comprehensive metabolic panel     Status: Abnormal   Collection  Time: 05/25/14  9:21 PM  Result Value Ref Range   Sodium 140 137 - 147 mEq/L   Potassium 4.0 3.7 - 5.3 mEq/L   Chloride 99 96 - 112 mEq/L   CO2 26 19 - 32 mEq/L   Glucose, Bld 140 (H) 70 - 99 mg/dL   BUN 12 6 - 23 mg/dL   Creatinine, Ser 1.03 0.50 - 1.35 mg/dL   Calcium 10.0 8.4 - 10.5 mg/dL   Total Protein 7.7 6.0 - 8.3 g/dL   Albumin 4.3 3.5 - 5.2 g/dL   AST 35 0 - 37 U/L   ALT 57 (H) 0 - 53 U/L   Alkaline Phosphatase 56 39 - 117 U/L   Total Bilirubin 0.8 0.3 - 1.2 mg/dL   GFR calc non Af Amer 82 (L) >90 mL/min   GFR calc Af Amer >90 >90 mL/min    Comment: (NOTE) The eGFR has been calculated using the CKD EPI equation. This calculation has not been validated in all clinical situations. eGFR's persistently <90 mL/min signify possible Chronic Kidney Disease.    Anion gap 15 5 - 15  Lipase, blood     Status: Abnormal   Collection Time: 05/25/14  9:21 PM  Result Value  Ref Range   Lipase 113 (H) 11 - 59 U/L  Urinalysis, Routine w reflex microscopic     Status: Abnormal   Collection Time: 05/25/14 10:26 PM  Result Value Ref Range   Color, Urine YELLOW YELLOW   APPearance CLEAR CLEAR   Specific Gravity, Urine 1.022 1.005 - 1.030   pH 5.0 5.0 - 8.0   Glucose, UA NEGATIVE NEGATIVE mg/dL   Hgb urine dipstick NEGATIVE NEGATIVE   Bilirubin Urine SMALL (A) NEGATIVE   Ketones, ur 15 (A) NEGATIVE mg/dL   Protein, ur NEGATIVE NEGATIVE mg/dL   Urobilinogen, UA 0.2 0.0 - 1.0 mg/dL   Nitrite NEGATIVE NEGATIVE   Leukocytes, UA NEGATIVE NEGATIVE    Comment: MICROSCOPIC NOT DONE ON URINES WITH NEGATIVE PROTEIN, BLOOD, LEUKOCYTES, NITRITE, OR GLUCOSE <1000 mg/dL.   Ct Abdomen Pelvis W Contrast  05/26/2014   CLINICAL DATA:  Periumbilical and right lower abdominal pain with nausea and vomiting for 1 day.  EXAM: CT ABDOMEN AND PELVIS WITH CONTRAST  TECHNIQUE: Multidetector CT imaging of the abdomen and pelvis was performed using the standard protocol following bolus administration of  intravenous contrast.  CONTRAST:  119m OMNIPAQUE IOHEXOL 300 MG/ML  SOLN  COMPARISON:  None.  FINDINGS: Lung bases are clear. Small esophageal hiatal hernia. Residual contrast material in the lower esophagus may represent dysmotility or reflux.  Diffuse fatty infiltration of the liver. The gallbladder, spleen, pancreas, adrenal glands, kidneys, abdominal aorta, inferior vena cava, and retroperitoneal lymph nodes are unremarkable. Stomach, small bowel, and colon are decompressed. No free air or free fluid in the abdomen.  Pelvis: There is fluid-filled distention of the appendix with maximal diameter measuring about 13 mm. Several at appendicolith sore present in the base of the appendix. There is mild inflammatory stranding around the appendix. Changes are consistent with early acute appendicitis. No abscess is demonstrated.  Prostate gland is enlarged, measuring 5.4 x 4.2 cm. Bladder wall is not thickened. No free or loculated pelvic fluid collections. No pelvic mass or lymphadenopathy. No destructive bone lesions.  IMPRESSION: Changes of early acute appendicitis with an appendicolith. No abscess. Prostate enlargement. Fatty infiltration of the liver. Esophageal hiatal hernia with contrast material in the lower esophagus.   Electronically Signed   By: WLucienne CapersM.D.   On: 05/26/2014 01:24    Review of Systems  Constitutional: Positive for malaise/fatigue. Negative for fever and chills.  HENT:       Dry mouth  Eyes: Negative.   Respiratory: Negative.   Cardiovascular: Negative.   Gastrointestinal: Positive for nausea, vomiting and abdominal pain.  Genitourinary: Negative.   Musculoskeletal: Negative.   Skin: Negative.   Neurological: Negative.   Endo/Heme/Allergies: Negative.   Psychiatric/Behavioral: Negative.     Blood pressure 145/79, pulse 71, temperature 98.6 F (37 C), temperature source Oral, resp. rate 11, SpO2 93 %. Physical Exam  Constitutional: He is oriented to person, place,  and time. He appears well-developed and well-nourished. No distress.  HENT:  Head: Normocephalic. Head is without abrasion and without contusion.  Right Ear: Hearing, tympanic membrane, external ear and ear canal normal.  Left Ear: Hearing, tympanic membrane, external ear and ear canal normal.  Nose: No rhinorrhea, sinus tenderness or nasal deformity.  Mouth/Throat: Uvula is midline, oropharynx is clear and moist and mucous membranes are normal.  Eyes: Conjunctivae and EOM are normal. Pupils are equal, round, and reactive to light. Right eye exhibits no discharge. Left eye exhibits no discharge.  Neck: Normal range of motion. Neck supple.  Cardiovascular: Normal rate, normal heart sounds and intact distal pulses.   Respiratory: Effort normal and breath sounds normal. No respiratory distress. He has no wheezes. He has no rales.  GI: Bowel sounds are normal. He exhibits no distension. There is tenderness. There is guarding. There is no rebound.  Right lower quadrant tenderness with voluntary guarding, positive Rozvig.s sign  Musculoskeletal: Normal range of motion. He exhibits no edema.  Neurological: He is alert and oriented to person, place, and time. He exhibits normal muscle tone.  Skin: Skin is warm and dry.  Psychiatric: He has a normal mood and affect.     Assessment/Plan Acute appendicitis: We'll admit, give IV antibiotics, and prepare for laparoscopic appendectomy this morning. Procedure, risks, and benefits were discussed in detail with him and his wife. He is agreeable. I will review with Dr. Georgette Dover.  Vinette Crites E 05/26/2014, 3:53 AM

## 2014-05-27 MED ORDER — OXYCODONE-ACETAMINOPHEN 5-325 MG PO TABS
1.0000 | ORAL_TABLET | ORAL | Status: DC | PRN
Start: 2014-05-27 — End: 2018-07-21

## 2014-05-27 MED ORDER — PANTOPRAZOLE SODIUM 40 MG PO TBEC: 40.0000 mg | DELAYED_RELEASE_TABLET | Freq: Every day | ORAL | Status: DC

## 2014-05-27 NOTE — Progress Notes (Signed)
Utilization Review completed.  

## 2014-05-27 NOTE — Discharge Summary (Signed)
Patient ID: Steven Huber MRN: [id number] DOB/AGE: 51/08/1960 51 y.o.  Admit date: 05/25/2014 Discharge date: 05/27/2014  Procedures: lap appy  Consults: None  Reason for Admission: Steven Huber developed generalized abdominal pain yesterday approximately 1 PM. He had associated nausea and vomiting. The pain gradually localized more to the right side of his abdomen. He came to the emergency department for evaluation. Workup included laboratory studies demonstrated leukocytosis of 14,500. CT scan of the abdomen and pelvis was done which demonstrates appendicolith and early acute appendicitis. He continues to have some pain.  Admission Diagnoses:  1. Acute appendicitis  Hospital Course: The patient was admitted and taken to the OR where he underwent a lap appy.  He tolerated this well and on POD 1, he was tolerating a regular diet and his pain was well controlled.  He was stable for dc home.  PE: Abd: soft, appropriately tender, +BS, ND, incisions c/d/i  Discharge Diagnoses:  Active Problems:   Acute appendicitis s/p lap appy  Discharge Medications:   Medication List    TAKE these medications        aspirin EC 81 MG tablet  Take 81 mg by mouth daily.     CRESTOR 10 MG tablet  Generic drug:  rosuvastatin  Take 10 mg by mouth daily.     fluticasone 50 MCG/ACT nasal spray  Commonly known as:  FLONASE  Place 1 spray into both nostrils daily.     ibuprofen 200 MG tablet  Commonly known as:  ADVIL,MOTRIN  Take 400 mg by mouth every 6 (six) hours as needed for mild pain.     omeprazole 20 MG capsule  Commonly known as:  PRILOSEC  Take 20 mg by mouth daily.     oxyCODONE-acetaminophen 5-325 MG per tablet  Commonly known as:  PERCOCET/ROXICET  Take 1-2 tablets by mouth every 4 (four) hours as needed for moderate pain.        Discharge Instructions:     Follow-up Information    [facility].   Why:  3:15pm, arrive no later than  2:45pm for paperwork   Contact information:   [address]   [place] [address] [phone]       Signed: Letha CapeOSBORNE,Ellington Greenslade E 05/27/2014, 8:50 AM

## 2014-05-27 NOTE — Discharge Planning (Signed)
Copy of AVS and RX to pt who verbalizes understanding.  Will medicate for pain at 1030 and plan dc around 1100. Will dc with all personal belongings to private car home, acomp. By wife.

## 2014-05-27 NOTE — Plan of Care (Signed)
Problem: Phase I Progression Outcomes Goal: Pain controlled with appropriate interventions Outcome: Completed/Met Date Met:  05/27/14     

## 2014-05-30 ENCOUNTER — Encounter (HOSPITAL_COMMUNITY): Payer: Self-pay | Admitting: Surgery

## 2015-08-06 ENCOUNTER — Other Ambulatory Visit: Payer: Self-pay | Admitting: Family Medicine

## 2015-08-06 DIAGNOSIS — R7989 Other specified abnormal findings of blood chemistry: Secondary | ICD-10-CM

## 2015-08-06 DIAGNOSIS — R945 Abnormal results of liver function studies: Secondary | ICD-10-CM

## 2015-08-14 ENCOUNTER — Ambulatory Visit
Admission: RE | Admit: 2015-08-14 | Discharge: 2015-08-14 | Disposition: A | Discharge status: Home / Self Care | Payer: BLUE CROSS/BLUE SHIELD | Source: Ambulatory Visit | Attending: Family Medicine | Admitting: Family Medicine

## 2015-08-14 DIAGNOSIS — R7989 Other specified abnormal findings of blood chemistry: Secondary | ICD-10-CM

## 2015-08-14 DIAGNOSIS — R945 Abnormal results of liver function studies: Secondary | ICD-10-CM

## 2015-10-12 DIAGNOSIS — K8020 Calculus of gallbladder without cholecystitis without obstruction: Secondary | ICD-10-CM | POA: Diagnosis not present

## 2015-10-12 DIAGNOSIS — K219 Gastro-esophageal reflux disease without esophagitis: Secondary | ICD-10-CM | POA: Diagnosis not present

## 2015-10-12 DIAGNOSIS — R945 Abnormal results of liver function studies: Secondary | ICD-10-CM | POA: Diagnosis not present

## 2015-10-12 DIAGNOSIS — E8801 Alpha-1-antitrypsin deficiency: Secondary | ICD-10-CM | POA: Diagnosis not present

## 2015-11-09 DIAGNOSIS — Z01818 Encounter for other preprocedural examination: Secondary | ICD-10-CM | POA: Diagnosis not present

## 2015-11-09 DIAGNOSIS — Z1211 Encounter for screening for malignant neoplasm of colon: Secondary | ICD-10-CM | POA: Diagnosis not present

## 2015-11-09 DIAGNOSIS — K219 Gastro-esophageal reflux disease without esophagitis: Secondary | ICD-10-CM | POA: Diagnosis not present

## 2015-12-12 DIAGNOSIS — K219 Gastro-esophageal reflux disease without esophagitis: Secondary | ICD-10-CM | POA: Diagnosis not present

## 2015-12-12 DIAGNOSIS — K5730 Diverticulosis of large intestine without perforation or abscess without bleeding: Secondary | ICD-10-CM | POA: Diagnosis not present

## 2015-12-12 DIAGNOSIS — D130 Benign neoplasm of esophagus: Secondary | ICD-10-CM | POA: Diagnosis not present

## 2015-12-12 DIAGNOSIS — D126 Benign neoplasm of colon, unspecified: Secondary | ICD-10-CM | POA: Diagnosis not present

## 2015-12-12 DIAGNOSIS — K2270 Barrett's esophagus without dysplasia: Secondary | ICD-10-CM | POA: Diagnosis not present

## 2015-12-12 DIAGNOSIS — K228 Other specified diseases of esophagus: Secondary | ICD-10-CM | POA: Diagnosis not present

## 2015-12-12 DIAGNOSIS — D124 Benign neoplasm of descending colon: Secondary | ICD-10-CM | POA: Diagnosis not present

## 2015-12-12 DIAGNOSIS — D369 Benign neoplasm, unspecified site: Secondary | ICD-10-CM | POA: Diagnosis not present

## 2015-12-12 DIAGNOSIS — Z1211 Encounter for screening for malignant neoplasm of colon: Secondary | ICD-10-CM | POA: Diagnosis not present

## 2016-04-01 DIAGNOSIS — R0981 Nasal congestion: Secondary | ICD-10-CM | POA: Diagnosis not present

## 2016-04-01 DIAGNOSIS — K2270 Barrett's esophagus without dysplasia: Secondary | ICD-10-CM | POA: Diagnosis not present

## 2016-04-01 DIAGNOSIS — R945 Abnormal results of liver function studies: Secondary | ICD-10-CM | POA: Diagnosis not present

## 2016-04-01 DIAGNOSIS — E785 Hyperlipidemia, unspecified: Secondary | ICD-10-CM | POA: Diagnosis not present

## 2016-07-08 DIAGNOSIS — R52 Pain, unspecified: Secondary | ICD-10-CM | POA: Diagnosis not present

## 2016-07-08 DIAGNOSIS — J101 Influenza due to other identified influenza virus with other respiratory manifestations: Secondary | ICD-10-CM | POA: Diagnosis not present

## 2016-09-23 IMAGING — CT CT ABD-PELV W/ CM
2 of 5 series · 16 of 46 positions shown, 18 images · IV contrast (omnipaque)
Comparison: None.

CLINICAL DATA: Periumbilical and right lower abdominal pain with
nausea and vomiting for 1 day.

EXAM:
CT ABDOMEN AND PELVIS WITH CONTRAST
TECHNIQUE: Multidetector CT imaging of the abdomen and pelvis was performed
using the standard protocol following bolus administration of
intravenous contrast.
CONTRAST:  100mL OMNIPAQUE IOHEXOL 300 MG/ML  SOLN

[Series 2: abd/ pelvis 5.0 i30f 1 · axial · 0.81mm/px · z∈[+781,+1251]mm · 13 of 106 slices shown, 15 images]
[im 6/106  soft-tissue]
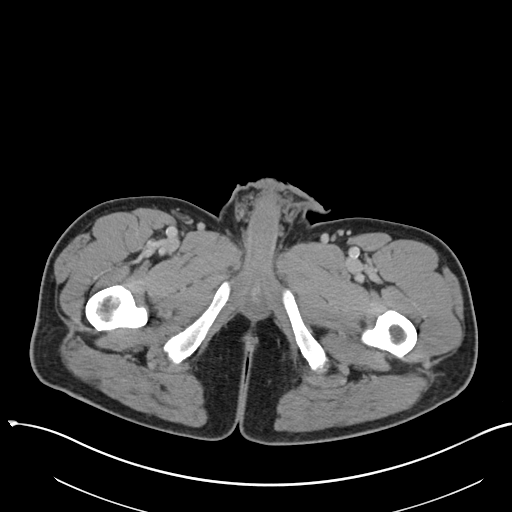
[im 6/106  bone]
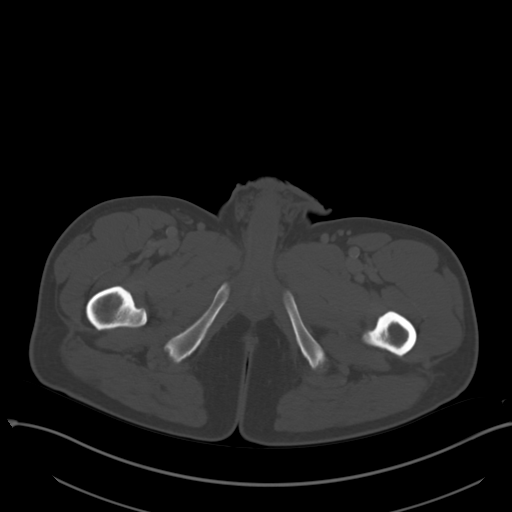
[im 17/106  soft-tissue]
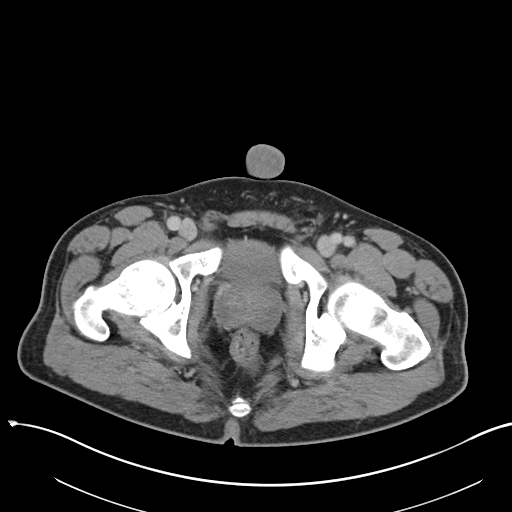
[im 23/106  soft-tissue]
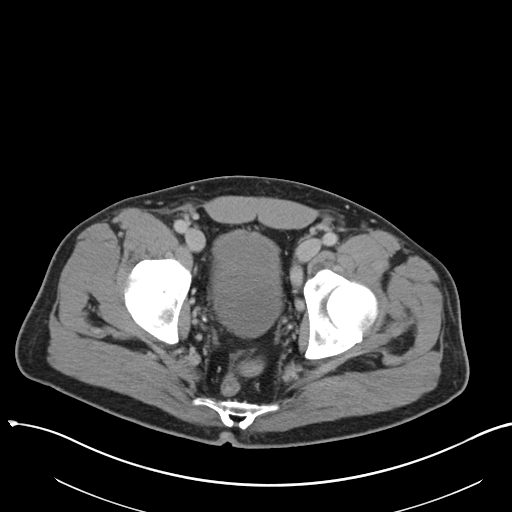
[im 28/106  soft-tissue]
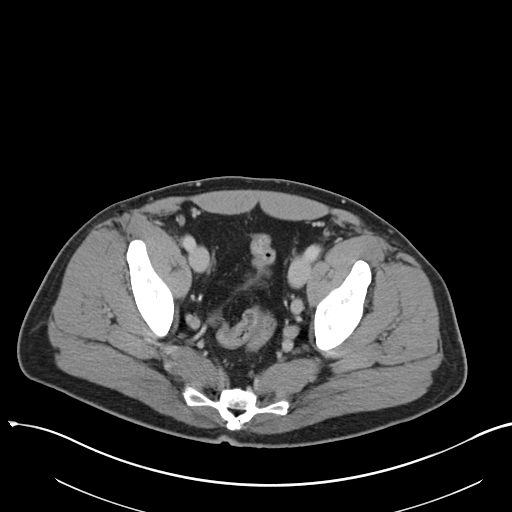
[im 39/106  soft-tissue]
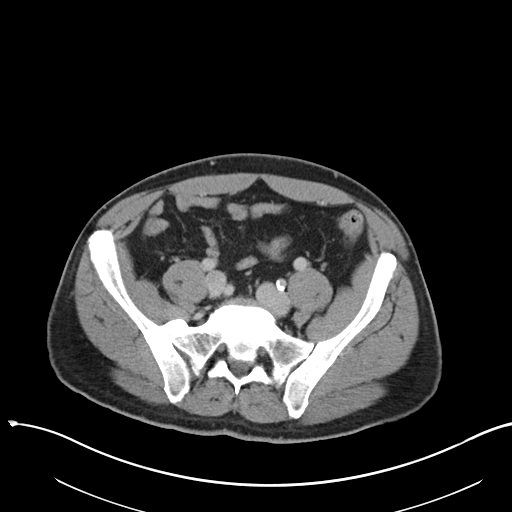
[im 45/106  soft-tissue]
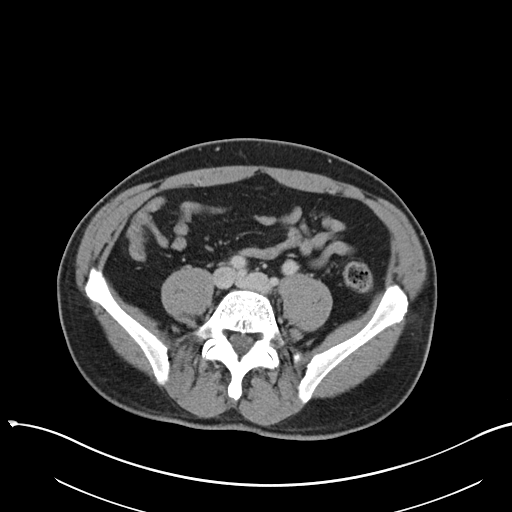
[im 56/106  soft-tissue]
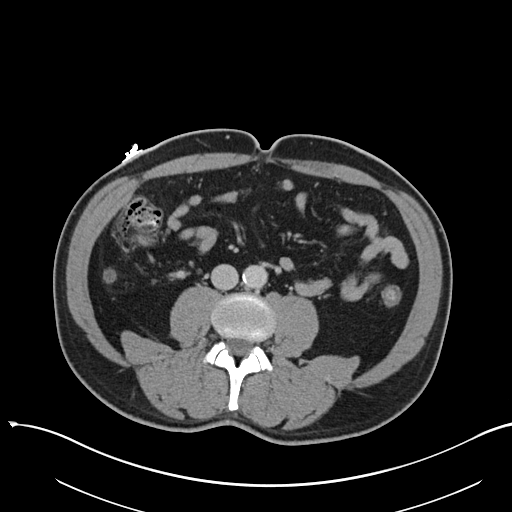
[im 61/106  soft-tissue]
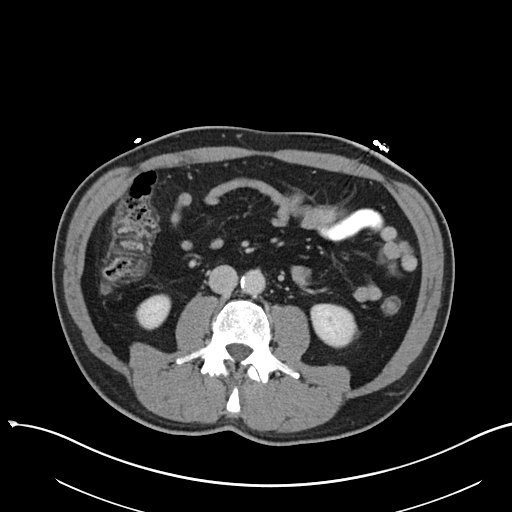
[im 67/106  soft-tissue]
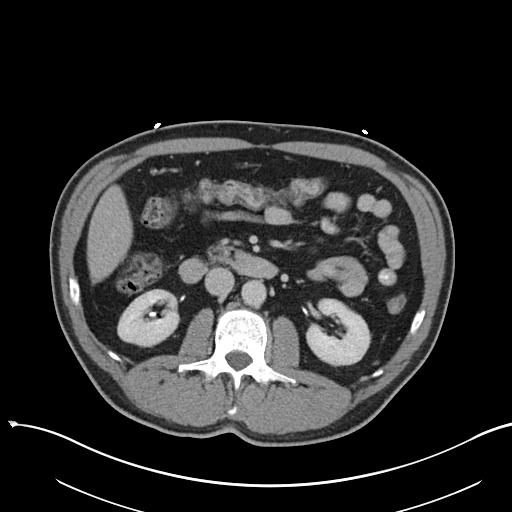
[im 67/106  bone]
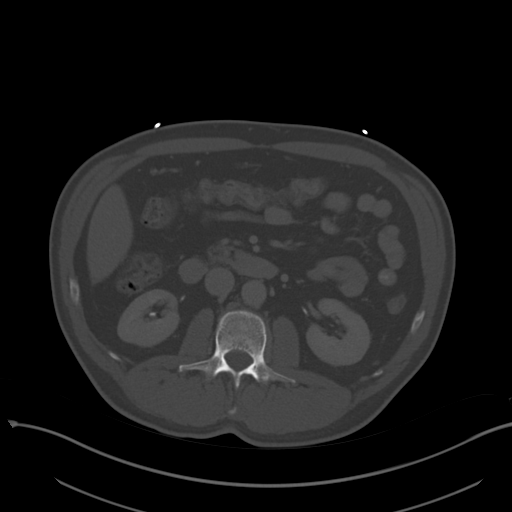
[im 78/106  soft-tissue]
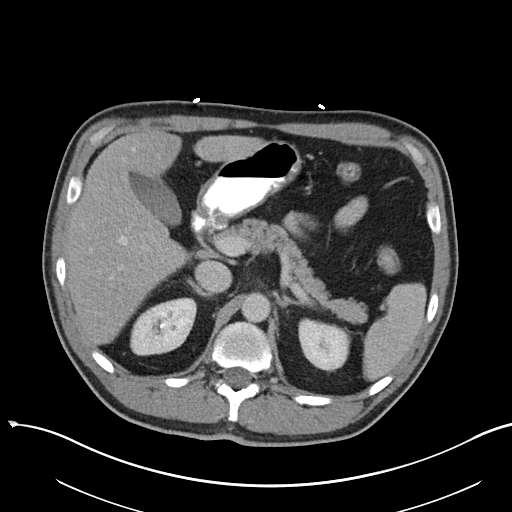
[im 83/106  soft-tissue]
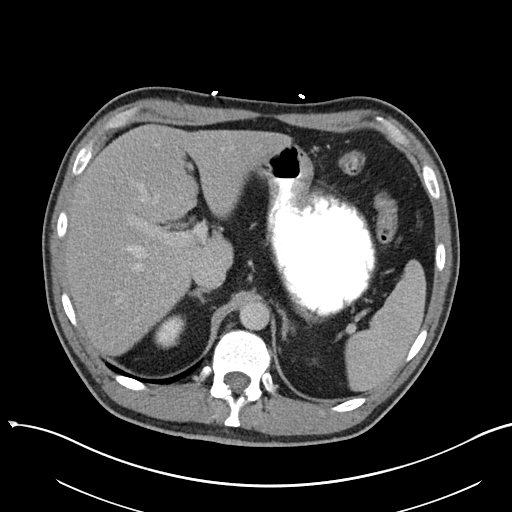
[im 89/106  soft-tissue]
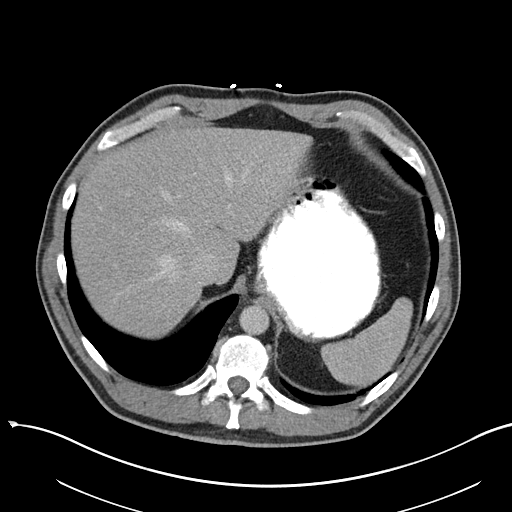
[im 100/106  soft-tissue]
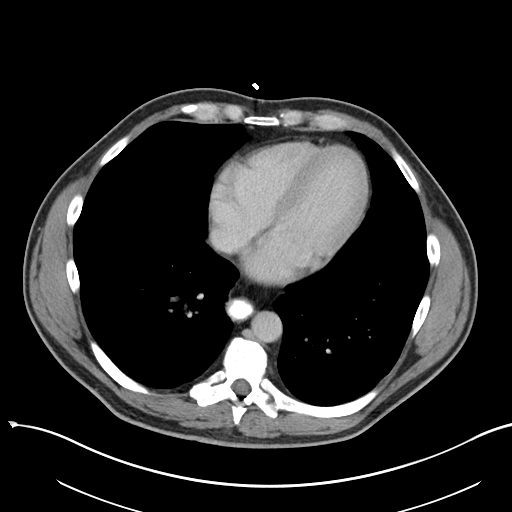

[Series 5: coronal soft tissue · coronal · 0.82mm/px · 3 of 85 slices shown]
[im 29/85  soft-tissue]
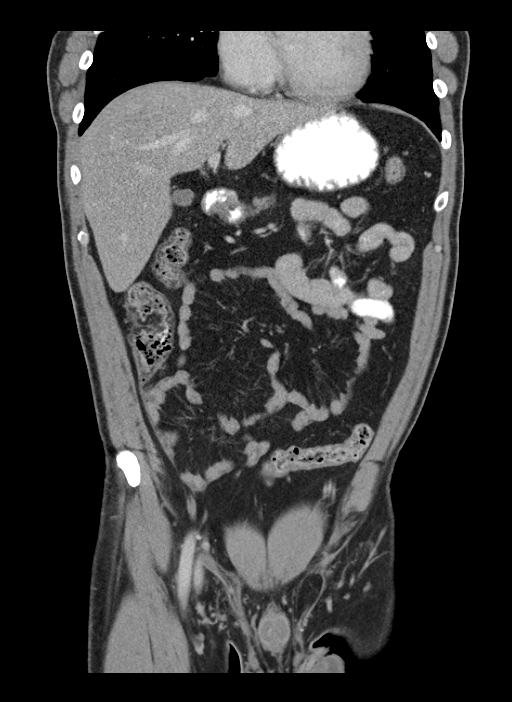
[im 38/85  soft-tissue]
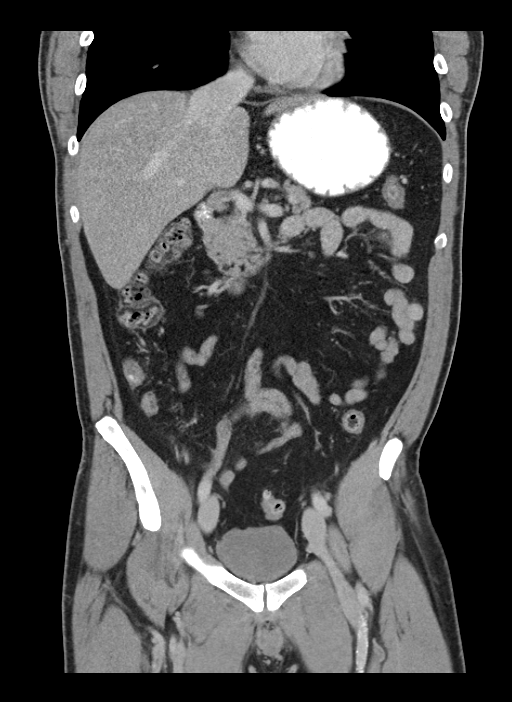
[im 47/85  soft-tissue]
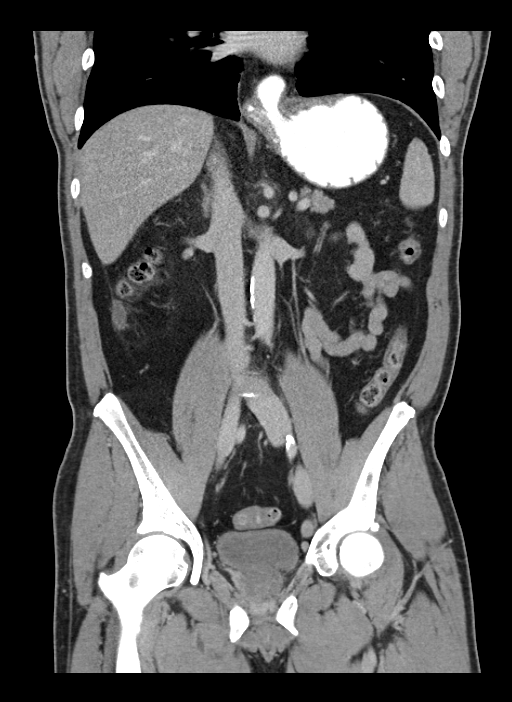

[16 of 46 positions shown; findings below may reference images not displayed]

FINDINGS: Lung bases are clear. Small esophageal hiatal hernia. Residual
contrast material in the lower esophagus may represent dysmotility
or reflux.

Diffuse fatty infiltration of the liver. The gallbladder, spleen,
pancreas, adrenal glands, kidneys, abdominal aorta, inferior vena
cava, and retroperitoneal lymph nodes are unremarkable. Stomach,
small bowel, and colon are decompressed. No free air or free fluid
in the abdomen.

Pelvis: There is fluid-filled distention of the appendix with
maximal diameter measuring about 13 mm. Several at appendicolith
sore present in the base of the appendix. There is mild inflammatory
stranding around the appendix. Changes are consistent with early
acute appendicitis. No abscess is demonstrated.

Prostate gland is enlarged, measuring 5.4 x 4.2 cm. Bladder wall is
not thickened. No free or loculated pelvic fluid collections. No
pelvic mass or lymphadenopathy. No destructive bone lesions.
IMPRESSION: Changes of early acute appendicitis with an appendicolith. No
abscess. Prostate enlargement. Fatty infiltration of the liver.
Esophageal hiatal hernia with contrast material in the lower
esophagus.

## 2017-05-04 DIAGNOSIS — K2270 Barrett's esophagus without dysplasia: Secondary | ICD-10-CM | POA: Diagnosis not present

## 2017-05-04 DIAGNOSIS — K219 Gastro-esophageal reflux disease without esophagitis: Secondary | ICD-10-CM | POA: Diagnosis not present

## 2017-05-04 DIAGNOSIS — E785 Hyperlipidemia, unspecified: Secondary | ICD-10-CM | POA: Diagnosis not present

## 2017-05-04 DIAGNOSIS — E8801 Alpha-1-antitrypsin deficiency: Secondary | ICD-10-CM | POA: Diagnosis not present

## 2017-05-04 DIAGNOSIS — E559 Vitamin D deficiency, unspecified: Secondary | ICD-10-CM | POA: Diagnosis not present

## 2017-10-05 DIAGNOSIS — E559 Vitamin D deficiency, unspecified: Secondary | ICD-10-CM | POA: Diagnosis not present

## 2017-10-05 DIAGNOSIS — Z125 Encounter for screening for malignant neoplasm of prostate: Secondary | ICD-10-CM | POA: Diagnosis not present

## 2017-10-05 DIAGNOSIS — E785 Hyperlipidemia, unspecified: Secondary | ICD-10-CM | POA: Diagnosis not present

## 2017-10-05 DIAGNOSIS — K2270 Barrett's esophagus without dysplasia: Secondary | ICD-10-CM | POA: Diagnosis not present

## 2017-10-05 DIAGNOSIS — Z23 Encounter for immunization: Secondary | ICD-10-CM | POA: Diagnosis not present

## 2017-10-05 DIAGNOSIS — Z0000 Encounter for general adult medical examination without abnormal findings: Secondary | ICD-10-CM | POA: Diagnosis not present

## 2017-10-05 DIAGNOSIS — R7303 Prediabetes: Secondary | ICD-10-CM | POA: Diagnosis not present

## 2017-10-05 DIAGNOSIS — K219 Gastro-esophageal reflux disease without esophagitis: Secondary | ICD-10-CM | POA: Diagnosis not present

## 2017-11-11 DIAGNOSIS — L03019 Cellulitis of unspecified finger: Secondary | ICD-10-CM | POA: Diagnosis not present

## 2017-12-11 DIAGNOSIS — E785 Hyperlipidemia, unspecified: Secondary | ICD-10-CM | POA: Diagnosis not present

## 2017-12-12 IMAGING — US US ABDOMEN COMPLETE
1 series · 13 of 25 positions shown · non-contrast
Comparison: Abdominal pelvic CT scan May 26, 2014.

CLINICAL DATA: Abnormal liver function studies

EXAM:
ABDOMEN ULTRASOUND COMPLETE

[Series 1: us abdomen complete · 0.32mm/px · 13 of 81 slices shown]
[im 1/81]
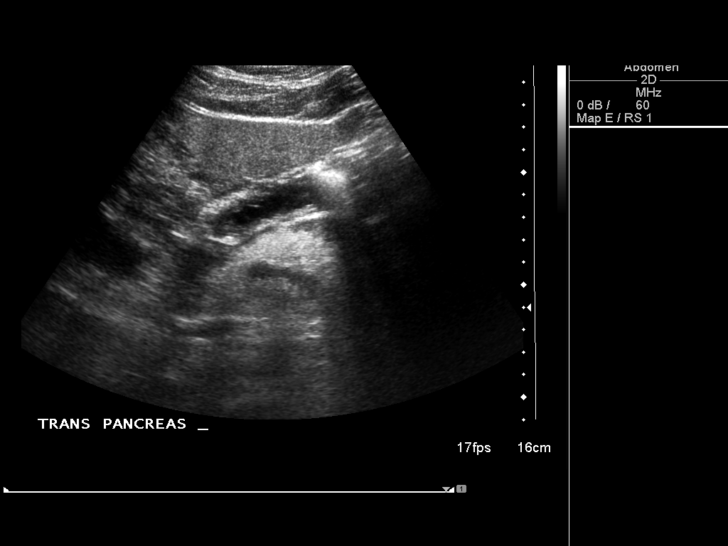
[im 7/81]
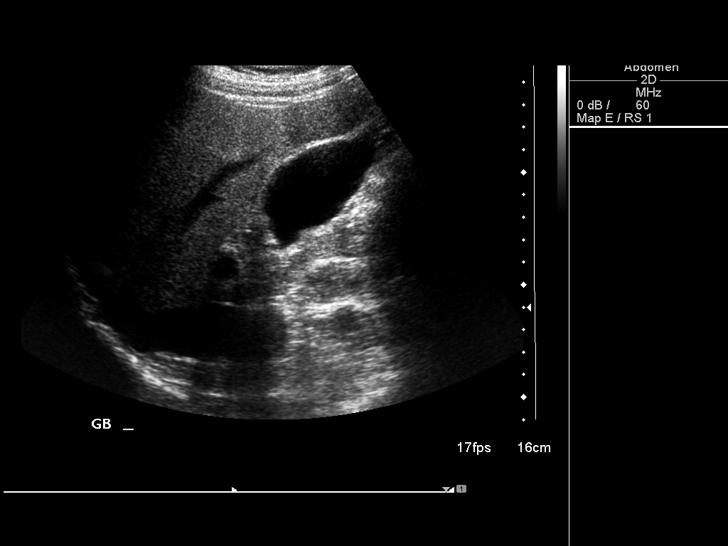
[im 14/81]
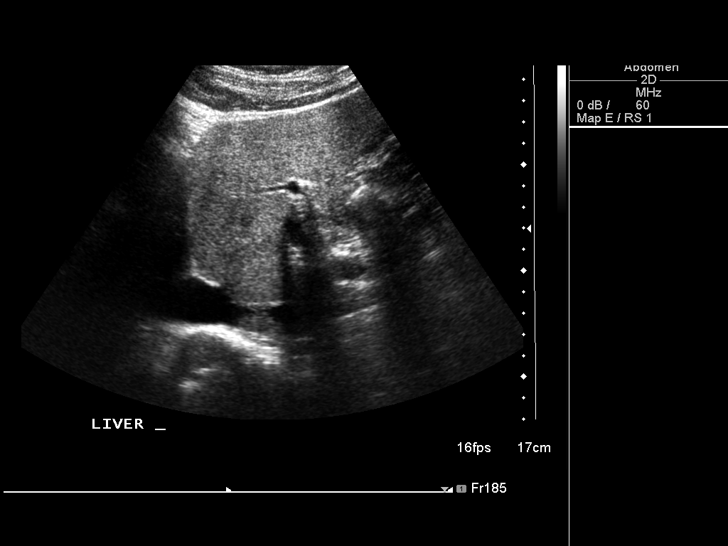
[im 21/81]
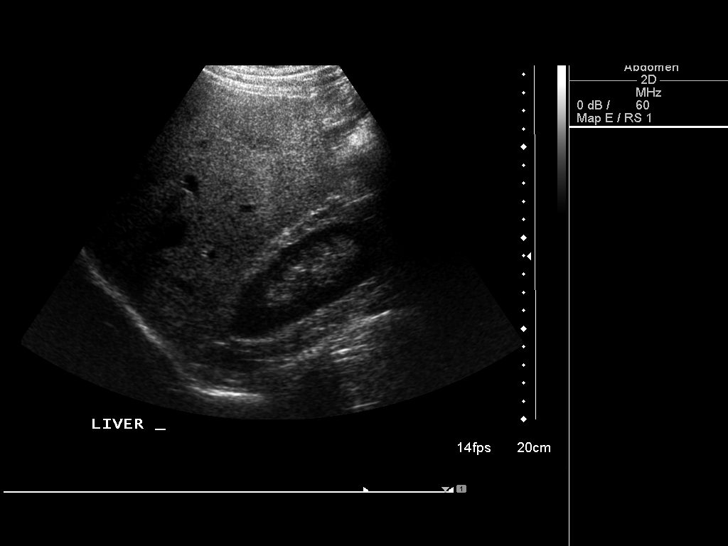
[im 27/81]
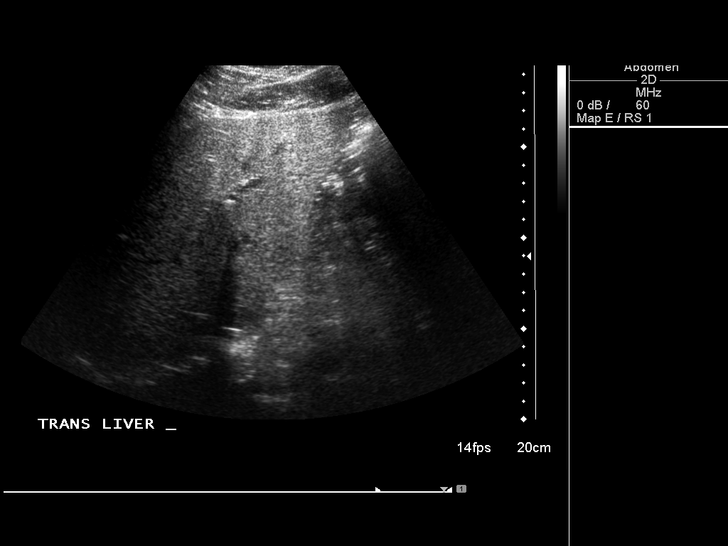
[im 34/81]
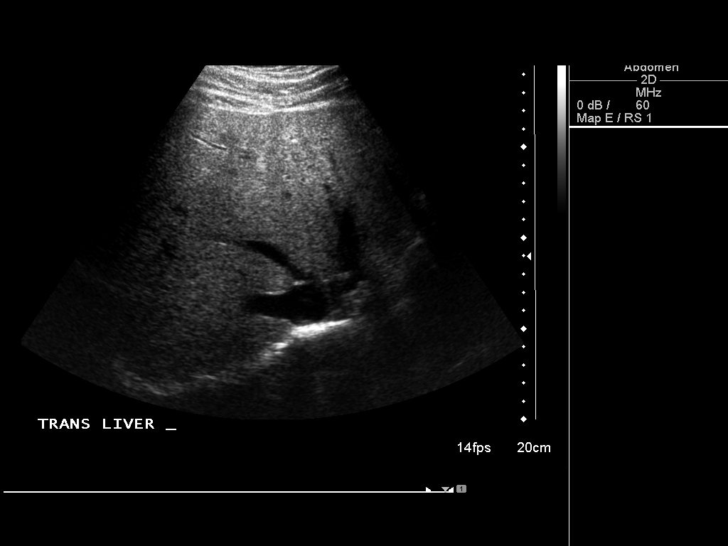
[im 41/81]
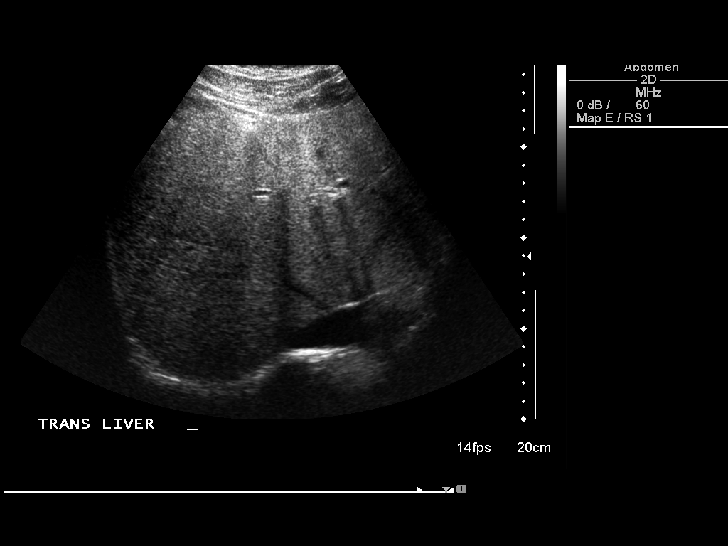
[im 47/81]
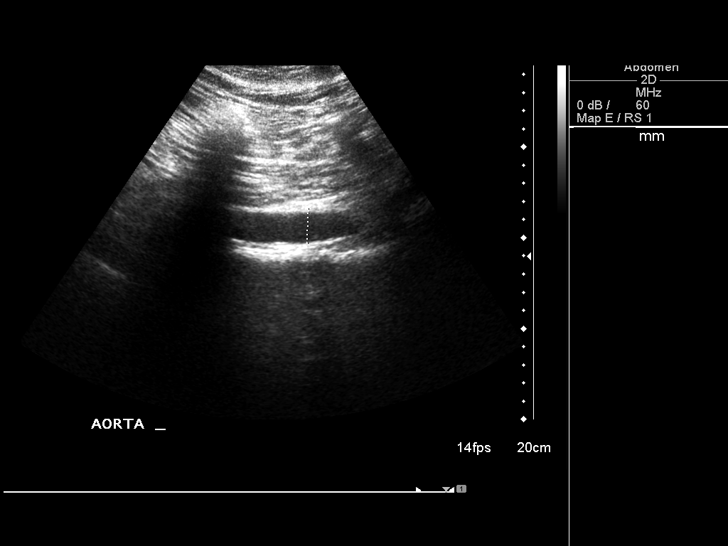
[im 54/81]
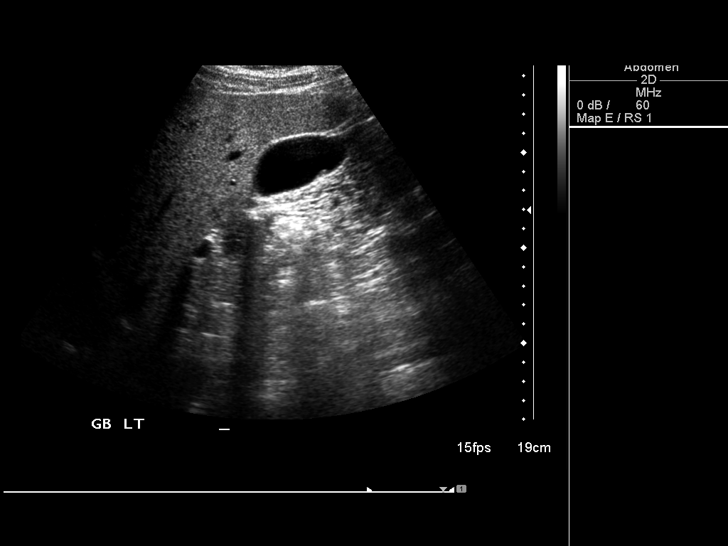
[im 61/81]
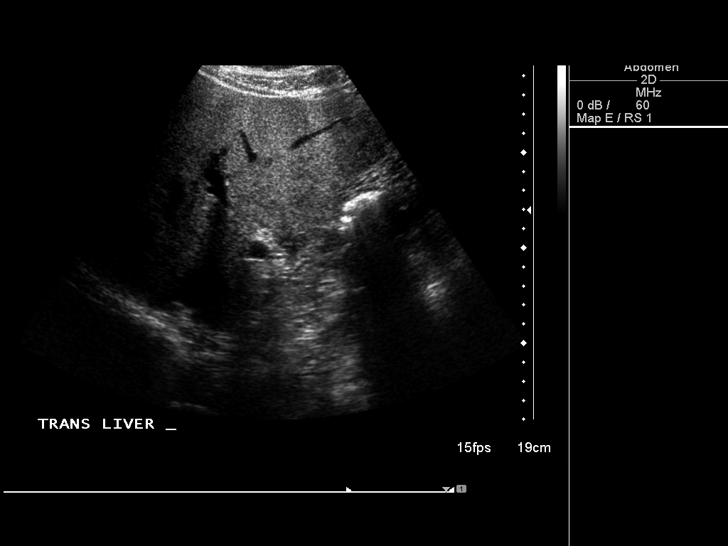
[im 67/81]
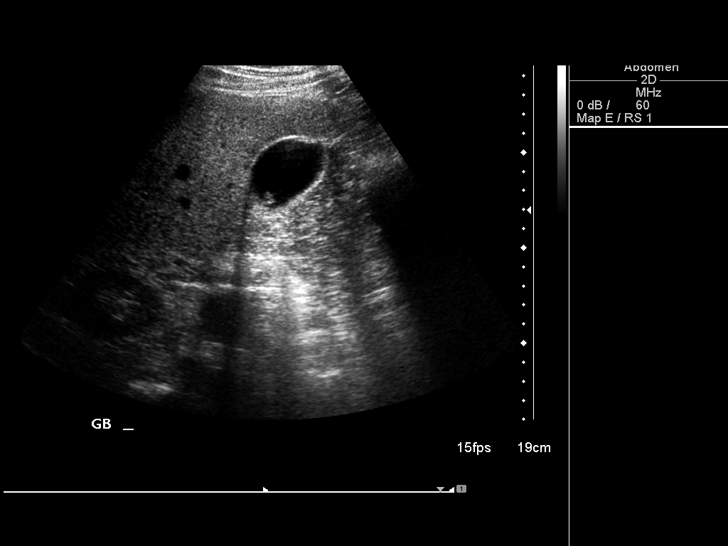
[im 74/81]
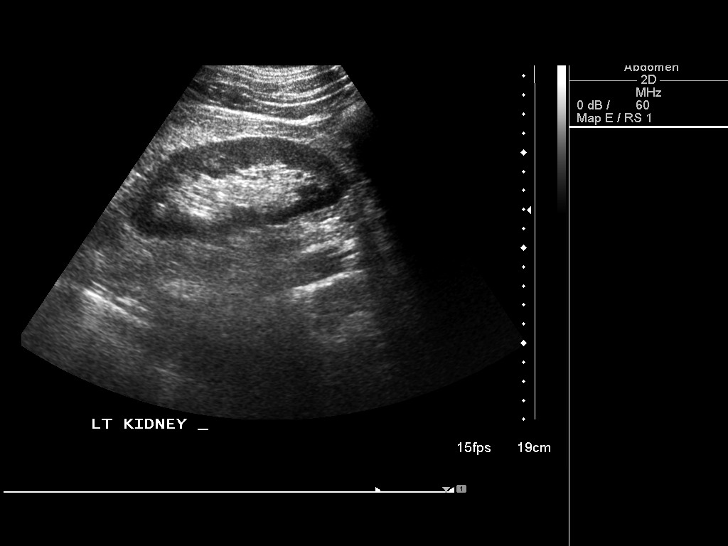
[im 81/81]
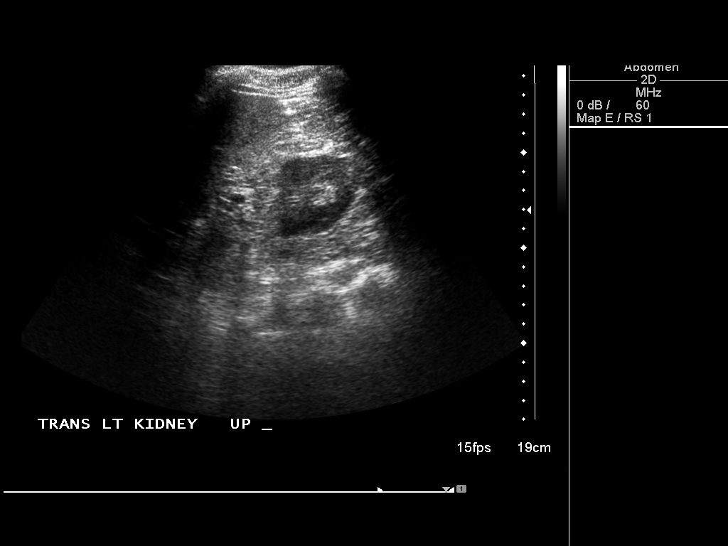

[13 of 25 positions shown; findings below may reference images not displayed]

FINDINGS: Gallbladder: The gallbladder is adequately distended. There tiny
echogenic mobile shadowing stones measuring up to 3 mm in diameter.
There is no gallbladder wall thickening, pericholecystic fluid, or
positive sonographic Murphy's sign.

Common bile duct: Diameter: 5.2 mm

Liver: The hepatic echotexture is mildly increased diffusely. There
is no focal mass or ductal dilation. The surface contour of the
liver is normal.

IVC: No abnormality visualized.

Pancreas: Visualized portion unremarkable.

Spleen: Size and appearance within normal limits.

Right Kidney: Length: 10.6 cm. Echogenicity within normal limits. No
mass or hydronephrosis visualized.

Left Kidney: Length: 11.1 cm. Echogenicity within normal limits. No
mass or hydronephrosis visualized.

Abdominal aorta: No aneurysm visualized.

Other findings: There is no ascites.
IMPRESSION: 1. Gallstones without sonographic evidence of acute cholecystitis.
2. Fatty infiltrative change of the liver. No discrete hepatic
masses are observed.
3. No intra-abdominal abnormality is observed elsewhere.

## 2018-05-04 DIAGNOSIS — K2270 Barrett's esophagus without dysplasia: Secondary | ICD-10-CM | POA: Diagnosis not present

## 2018-05-06 DIAGNOSIS — D225 Melanocytic nevi of trunk: Secondary | ICD-10-CM | POA: Diagnosis not present

## 2018-05-06 DIAGNOSIS — L300 Nummular dermatitis: Secondary | ICD-10-CM | POA: Diagnosis not present

## 2018-05-06 DIAGNOSIS — L570 Actinic keratosis: Secondary | ICD-10-CM | POA: Diagnosis not present

## 2018-05-06 DIAGNOSIS — D2262 Melanocytic nevi of left upper limb, including shoulder: Secondary | ICD-10-CM | POA: Diagnosis not present

## 2018-05-06 DIAGNOSIS — L821 Other seborrheic keratosis: Secondary | ICD-10-CM | POA: Diagnosis not present

## 2018-05-06 DIAGNOSIS — D485 Neoplasm of uncertain behavior of skin: Secondary | ICD-10-CM | POA: Diagnosis not present

## 2018-05-07 DIAGNOSIS — K2270 Barrett's esophagus without dysplasia: Secondary | ICD-10-CM | POA: Diagnosis not present

## 2018-05-25 ENCOUNTER — Ambulatory Visit (INDEPENDENT_AMBULATORY_CARE_PROVIDER_SITE_OTHER): Payer: BLUE CROSS/BLUE SHIELD

## 2018-05-25 ENCOUNTER — Other Ambulatory Visit: Payer: Self-pay

## 2018-05-25 ENCOUNTER — Ambulatory Visit (HOSPITAL_COMMUNITY)
Admission: EM | Admit: 2018-05-25 | Discharge: 2018-05-25 | Disposition: A | Discharge status: Home / Self Care | Payer: BLUE CROSS/BLUE SHIELD | Attending: Family Medicine | Admitting: Family Medicine

## 2018-05-25 ENCOUNTER — Encounter (HOSPITAL_COMMUNITY): Payer: Self-pay

## 2018-05-25 DIAGNOSIS — M7750 Other enthesopathy of unspecified foot: Secondary | ICD-10-CM | POA: Diagnosis not present

## 2018-05-25 DIAGNOSIS — M25571 Pain in right ankle and joints of right foot: Secondary | ICD-10-CM | POA: Diagnosis not present

## 2018-05-25 MED ORDER — MELOXICAM 15 MG PO TABS: 15.0000 mg | ORAL_TABLET | Freq: Every day | ORAL | 0 refills | Status: DC

## 2018-05-25 NOTE — Discharge Instructions (Addendum)
X-ray did not reveal any bony abnormalities This is most likely tendinitis Make sure that your running shoes are not worn out and correct for your feet and style of running.  I am to give you meloxicam for pain inflammation I would like for you to rest the ankle, ice it and elevate it as much as possible We will place a wrap on the ankle to help with compression and swelling For continued or worsening symptoms please follow-up with orthopedic

## 2018-05-25 NOTE — ED Triage Notes (Addendum)
Pt has right ankle pain. Pt states this started 3 days ago. Pt ankle is swollen pain radiates down in too his foot as well. Pt is unable to stand.

## 2018-05-25 NOTE — ED Provider Notes (Signed)
[facility]    CSN: [id number] Arrival date & time: 05/25/18  [id number]     History   Chief Complaint Chief Complaint  Patient presents with  . Foot Pain  . Ankle Pain    HPI Philmore PaliWalter Di Mond is a 57 y.o. male.   Patient is a 57 year old male that presents today for right ankle pain and swelling.  This is been constant and worsening over the last 3 days.  He denies any specific injury to the ankle but he is a runner.  Reports has not ran a couple of weeks due to intermittent discomfort when running.  He denies any history of the same.  He has been taking ibuprofen for pain with minimal relief.  He denies any icing, elevating or compression of the ankle.  The swelling is located in the lateral and medial malleolus and pain is pinpointed there with radiation into right lower leg.  He denies any numbness, tingling.  He is having a hard time ambulating on the ankle.  Limited range of motion due to pain.   ROS per HPI      Past Medical History:  Diagnosis Date  . GERD (gastroesophageal reflux disease)   . Hyperlipidemia     Patient Active Problem List   Diagnosis Date Noted  . Acute appendicitis 05/26/2014    Past Surgical History:  Procedure Laterality Date  . LAPAROSCOPIC APPENDECTOMY N/A 05/26/2014   Procedure: APPENDECTOMY LAPAROSCOPIC;  Surgeon: Manus RuddMatthew Tsuei, MD;  Location: [facility] OR;  Service: General;  Laterality: N/A;       Home Medications    Prior to Admission medications   Medication Sig Start Date End Date Taking? Authorizing Provider  aspirin EC 81 MG tablet Take 81 mg by mouth daily.    [provider]  CRESTOR 10 MG tablet Take 10 mg by mouth daily. 05/14/14   [provider]  fluticasone (FLONASE) 50 MCG/ACT nasal spray Place 1 spray into both nostrils daily.    [provider]  ibuprofen (ADVIL,MOTRIN) 200 MG tablet Take 400 mg by mouth every 6 (six) hours as needed for mild pain.    [provider]  meloxicam  (MOBIC) 15 MG tablet Take 1 tablet (15 mg total) by mouth daily. 05/25/18   Dahlia ByesBast, Johncharles Fusselman A, NP  omeprazole (PRILOSEC) 20 MG capsule Take 20 mg by mouth daily.    [provider]  oxyCODONE-acetaminophen (PERCOCET/ROXICET) 5-325 MG per tablet Take 1-2 tablets by mouth every 4 (four) hours as needed for moderate pain. 05/27/14   Barnetta Chapelsborne, Kelly, PA-C    Family History Family History  Problem Relation Age of Onset  . Hypertension Mother   . Diabetes Mother     Social History Social History   Tobacco Use  . Smoking status: Never Smoker  . Smokeless tobacco: Never Used  Substance Use Topics  . Alcohol use: Yes    Comment: few beers a night at least 4 night a week.  . Drug use: No     Allergies   Patient has no known allergies.   Review of Systems Review of Systems   Physical Exam Triage Vital Signs ED Triage Vitals  Enc Vitals Group     BP 05/25/18 0902 128/82     Pulse Rate 05/25/18 0902 73     Resp 05/25/18 0902 16     Temp 05/25/18 0902 98.2 F (36.8 C)     Temp Source 05/25/18 0902 Oral     SpO2 05/25/18 0902 100 %  Weight 05/25/18 0923 185 lb (83.9 kg)     Height --      Head Circumference --      Peak Flow --      Pain Score 05/25/18 0922 10     Pain Loc --      Pain Edu? --      Excl. in GC? --    No data found.  Updated Vital Signs BP 128/82 (BP Location: Left Arm)   Pulse 73   Temp 98.2 F (36.8 C) (Oral)   Resp 16   Wt 185 lb (83.9 kg)   SpO2 100%   BMI 25.80 kg/m   Visual Acuity Right Eye Distance:   Left Eye Distance:   Bilateral Distance:    Right Eye Near:   Left Eye Near:    Bilateral Near:     Physical Exam  Constitutional: He appears well-developed and well-nourished.  HENT:  Head: Normocephalic and atraumatic.  Eyes: Conjunctivae are normal.  Neck: Normal range of motion.  Pulmonary/Chest: Effort normal.  Musculoskeletal: He exhibits edema and tenderness. He exhibits no deformity.  Limited range of motion of the  right ankle due to pain and swelling.  Pinpoint tenderness to lateral and medial malleolus with mild swelling.  No erythema, ecchymosis or deformities.  Temperature normal, pedal pulse and sensation intact  Neurological: He is alert.  Skin: Skin is warm.  Psychiatric: He has a normal mood and affect.  Nursing note and vitals reviewed.    UC Treatments / Results  Labs (all labs ordered are listed, but only abnormal results are displayed) Labs Reviewed - No data to display  EKG None  Radiology Dg Ankle Complete Right  Result Date: 05/25/2018 CLINICAL DATA:  Right ankle pain with no known injury. Inability to bear weight. EXAM: RIGHT ANKLE - COMPLETE 3+ VIEW COMPARISON:  None. FINDINGS: The bones are subjectively adequately mineralized. The ankle joint mortise is preserved. The talar dome is intact. There is no acute malleolar fracture. The talus and calcaneus exhibit no acute abnormality. There is an os supranaviculare along the superior medial aspect of the tarsal navicular. There is soft tissue swelling diffusely. IMPRESSION: No acute fracture nor dislocation of the ankle is observed. No erosive changes are demonstrated. There is soft tissue swelling as described. There is an accessory ossicle in the interspace between the superior aspects of the anterior talus and the posterosuperior aspect of the navicular which is a normal variant. Electronically Signed   By: David  [place] M.D.   On: 05/25/2018 10:39    Procedures Procedures (including critical care time)  Medications Ordered in UC Medications - No data to display  Initial Impression / Assessment and Plan / UC Course  I have reviewed the triage vital signs and the nursing notes.  Pertinent labs & imaging results that were available during my care of the patient were reviewed by me and considered in my medical decision making (see chart for details).    Patient is a 57 year old male that presents for worsening ankle pain over the  last 3 days.  He is a runner.  X-ray Revealed soft tissue swelling No bony abnormalities Most likely tendinitis We will do ankle brace, ice, elevate, crutches and meloxicam for pain inflammation If pain continues he will need to follow-up with orthopedic Final Clinical Impressions(s) / UC Diagnoses   Final diagnoses:  Tendinitis of ankle     Discharge Instructions     X-ray did not reveal any bony abnormalities This is  most likely tendinitis Make sure that your running shoes are not worn out and correct for your feet and style of running.  I am to give you meloxicam for pain inflammation I would like for you to rest the ankle, ice it and elevate it as much as possible We will place a wrap on the ankle to help with compression and swelling For continued or worsening symptoms please follow-up with orthopedic    ED Prescriptions    Medication Sig Dispense Auth. Provider   meloxicam (MOBIC) 15 MG tablet Take 1 tablet (15 mg total) by mouth daily. 30 tablet Dahlia Byes A, NP     Controlled Substance Prescriptions  Controlled Substance Registry consulted? Not Applicable   Janace Aris, NP 05/25/18 1110

## 2018-07-21 ENCOUNTER — Ambulatory Visit (HOSPITAL_COMMUNITY)
Admission: EM | Admit: 2018-07-21 | Discharge: 2018-07-21 | Disposition: A | Discharge status: Home / Self Care | Payer: BLUE CROSS/BLUE SHIELD | Attending: Family Medicine | Admitting: Family Medicine

## 2018-07-21 ENCOUNTER — Encounter (HOSPITAL_COMMUNITY): Payer: Self-pay | Admitting: Emergency Medicine

## 2018-07-21 ENCOUNTER — Other Ambulatory Visit: Payer: Self-pay

## 2018-07-21 DIAGNOSIS — M109 Gout, unspecified: Secondary | ICD-10-CM | POA: Insufficient documentation

## 2018-07-21 MED ORDER — MELOXICAM 15 MG PO TABS: 15.0000 mg | ORAL_TABLET | Freq: Every day | ORAL | 0 refills | Status: DC

## 2018-07-21 MED ORDER — COLCHICINE 0.6 MG PO TABS: ORAL_TABLET | ORAL | 0 refills | Status: DC

## 2018-07-21 NOTE — ED Provider Notes (Signed)
[facility]   [id number] 07/21/18 Arrival Time: 1222  CC: Right ankle and foot pain  SUBJECTIVE: History from: patient. Steven Huber is a 58 y.o. male complains of right ankle and foot pain that began abruptly a few days ago.  Denies a precipitating event or specific injury.  Localizes the pain to the inside of right ankle and GREAT toe.  Describes the pain as constant and sharp in character.  Has tried OTC medications with minimal relief.  Symptoms are made worse with weight-bearing.  Reports previous symptoms in November of 2019 and treated for tendinitis with meloxicam.  Reports worthwhile relief with treatment. Complains of associated erythema and swelling.   Denies fever, chills, nausea, vomiting, ecchymosis, weakness, numbness and tingling.      ROS: As per HPI.  Past Medical History:  Diagnosis Date  . GERD (gastroesophageal reflux disease)   . Hyperlipidemia    Past Surgical History:  Procedure Laterality Date  . LAPAROSCOPIC APPENDECTOMY N/A 05/26/2014   Procedure: APPENDECTOMY LAPAROSCOPIC;  Surgeon: [name], MD;  Location: [facility] OR;  Service: General;  Laterality: N/A;   No Known Allergies No current facility-administered medications on file prior to encounter.    Current Outpatient Medications on File Prior to Encounter  Medication Sig Dispense Refill  . CRESTOR 10 MG tablet Take 10 mg by mouth daily.  2  . omeprazole (PRILOSEC) 20 MG capsule Take 20 mg by mouth daily.    Marland Kitchen aspirin EC 81 MG tablet Take 81 mg by mouth daily.    . fluticasone (FLONASE) 50 MCG/ACT nasal spray Place 1 spray into both nostrils daily.     Social History   Socioeconomic History  . Marital status: Married    Spouse name: Not on file  . Number of children: Not on file  . Years of education: Not on file  . Highest education level: Not on file  Occupational History  . Not on file  Social Needs  . Financial resource strain: Not on file  . Food insecurity:    Worry: Not on  file    Inability: Not on file  . Transportation needs:    Medical: Not on file    Non-medical: Not on file  Tobacco Use  . Smoking status: Never Smoker  . Smokeless tobacco: Never Used  Substance and Sexual Activity  . Alcohol use: Yes    Comment: few beers a night at least 4 night a week.  . Drug use: No  . Sexual activity: Yes  Lifestyle  . Physical activity:    Days per week: Not on file    Minutes per session: Not on file  . Stress: Not on file  Relationships  . Social connections:    Talks on phone: Not on file    Gets together: Not on file    Attends religious service: Not on file    Active member of club or organization: Not on file    Attends meetings of clubs or organizations: Not on file    Relationship status: Not on file  . Intimate partner violence:    Fear of current or ex partner: Not on file    Emotionally abused: Not on file    Physically abused: Not on file    Forced sexual activity: Not on file  Other Topics Concern  . Not on file  Social History Narrative  . Not on file   Family History  Problem Relation Age of Onset  . Hypertension Mother   .  Diabetes Mother     OBJECTIVE:  Vitals:   07/21/18 1332  BP: 130/88  Pulse: 63  Temp: 98 F (36.7 C)  TempSrc: Oral  SpO2: 96%    General appearance: Alert; in no acute distress.  Head: NCAT Lungs: CTA bilaterally Heart: RRR.  Clear S1 and S2 without murmur, gallops, or rubs.  Dorsalis pedis pulse 2+; cap refill <2 seconds Musculoskeletal: RT foot Inspection: diffuse swelling over the medial and lateral ankle and dorsum of the foot.  Palpation: TTP over medial malleoli, and medial 1st MTP joint with mild overlying swelling and erythema ROM: LROM about ankle Strength: 4/5 dorsiflexion, 4/5 plantar flexion Skin: warm and dry Neurologic: Sitting in wheelchair; Sensation intact about the lower extremities Psychological: alert and cooperative; normal mood and affect  ASSESSMENT & PLAN:  1.  Acute gout involving toe of right foot, unspecified cause     Meds ordered this encounter  Medications  . colchicine 0.6 MG tablet    Sig: 1.2 mg orally at the first sign of a flare followed by 0.6 mg one hour later    Dispense:  3 tablet    Refill:  0    Order Specific Question:   Supervising Provider    Answer:   Eustace MooreNELSON, YVONNE SUE [[id number]]  . meloxicam (MOBIC) 15 MG tablet    Sig: Take 1 tablet (15 mg total) by mouth daily.    Dispense:  10 tablet    Refill:  0    Order Specific Question:   Supervising Provider    Answer:   Eustace MooreELSON, YVONNE SUE [[id number]]    Continue conservative management of rest, ice, and elevation Colchicine prescribed.  Take as directed and to completion Meloxicam also refilled.  Do not take this medication with other antiinflammatories as this may cause GI upset and lead to GI bleed. Follow up with PCP this Friday for further evaluation and management Return or go to the ER if you have any new or worsening symptoms (fever, chills, chest pain, abdominal pain, changes in bowel or bladder habits, pain radiating into lower legs, etc...)    Reviewed expectations re: course of current medical issues. Questions answered. Outlined signs and symptoms indicating need for more acute intervention. Patient verbalized understanding. After Visit Summary given.    Rennis HardingWurst, Antolin Belsito, PA-C 07/21/18 1444

## 2018-07-21 NOTE — Discharge Instructions (Addendum)
Continue conservative management of rest, ice, and elevation Colchicine prescribed.  Take as directed and to completion Meloxicam also refilled.  Do not take this medication with other antiinflammatories as this may cause GI upset and lead to GI bleed. Follow up with PCP this Friday for further evaluation and management Return or go to the ER if you have any new or worsening symptoms (fever, chills, chest pain, abdominal pain, changes in bowel or bladder habits, pain radiating into lower legs, etc...)

## 2018-07-21 NOTE — ED Triage Notes (Signed)
Pt here for a recurrent issue with his right ankle/foot. He has swelling and pain that radiates into his foot. Pt was seen here in November for the same issue and had negative xrays at that time.    Pt states he has an appt on Friday with PCP to start the process of seeing a specialist for his recurrent issue with this ankle.  Pt is here today for antiinflammatories and/or pain medication to get him through until Friday.

## 2018-07-23 DIAGNOSIS — M109 Gout, unspecified: Secondary | ICD-10-CM | POA: Diagnosis not present

## 2018-09-07 DIAGNOSIS — M109 Gout, unspecified: Secondary | ICD-10-CM | POA: Diagnosis not present

## 2019-02-03 DIAGNOSIS — Z0000 Encounter for general adult medical examination without abnormal findings: Secondary | ICD-10-CM | POA: Diagnosis not present

## 2019-04-19 DIAGNOSIS — Z1159 Encounter for screening for other viral diseases: Secondary | ICD-10-CM | POA: Diagnosis not present

## 2019-08-26 DIAGNOSIS — R21 Rash and other nonspecific skin eruption: Secondary | ICD-10-CM | POA: Diagnosis not present

## 2019-09-20 DIAGNOSIS — Z1152 Encounter for screening for COVID-19: Secondary | ICD-10-CM | POA: Diagnosis not present

## 2019-09-20 DIAGNOSIS — J029 Acute pharyngitis, unspecified: Secondary | ICD-10-CM | POA: Diagnosis not present

## 2019-09-20 DIAGNOSIS — M5432 Sciatica, left side: Secondary | ICD-10-CM | POA: Diagnosis not present

## 2019-11-01 DIAGNOSIS — E785 Hyperlipidemia, unspecified: Secondary | ICD-10-CM | POA: Diagnosis not present

## 2019-11-01 DIAGNOSIS — R21 Rash and other nonspecific skin eruption: Secondary | ICD-10-CM | POA: Diagnosis not present

## 2019-11-01 DIAGNOSIS — M5431 Sciatica, right side: Secondary | ICD-10-CM | POA: Diagnosis not present

## 2019-11-01 DIAGNOSIS — Z125 Encounter for screening for malignant neoplasm of prostate: Secondary | ICD-10-CM | POA: Diagnosis not present

## 2019-11-01 DIAGNOSIS — Z0000 Encounter for general adult medical examination without abnormal findings: Secondary | ICD-10-CM | POA: Diagnosis not present

## 2019-11-01 DIAGNOSIS — R7303 Prediabetes: Secondary | ICD-10-CM | POA: Diagnosis not present

## 2019-11-15 DIAGNOSIS — D369 Benign neoplasm, unspecified site: Secondary | ICD-10-CM | POA: Diagnosis not present

## 2019-11-15 DIAGNOSIS — R197 Diarrhea, unspecified: Secondary | ICD-10-CM | POA: Diagnosis not present

## 2019-12-20 DIAGNOSIS — M545 Low back pain: Secondary | ICD-10-CM | POA: Diagnosis not present

## 2020-09-05 DIAGNOSIS — K219 Gastro-esophageal reflux disease without esophagitis: Secondary | ICD-10-CM | POA: Diagnosis not present

## 2020-09-05 DIAGNOSIS — Z86010 Personal history of colonic polyps: Secondary | ICD-10-CM | POA: Diagnosis not present

## 2020-09-05 DIAGNOSIS — Z8719 Personal history of other diseases of the digestive system: Secondary | ICD-10-CM | POA: Diagnosis not present

## 2020-09-05 DIAGNOSIS — R1310 Dysphagia, unspecified: Secondary | ICD-10-CM | POA: Diagnosis not present

## 2020-09-13 DIAGNOSIS — M25561 Pain in right knee: Secondary | ICD-10-CM | POA: Diagnosis not present

## 2021-03-22 DIAGNOSIS — Z86010 Personal history of colonic polyps: Secondary | ICD-10-CM | POA: Diagnosis not present

## 2021-03-22 DIAGNOSIS — K2970 Gastritis, unspecified, without bleeding: Secondary | ICD-10-CM | POA: Diagnosis not present

## 2021-03-22 DIAGNOSIS — K2930 Chronic superficial gastritis without bleeding: Secondary | ICD-10-CM | POA: Diagnosis not present

## 2021-03-22 DIAGNOSIS — D123 Benign neoplasm of transverse colon: Secondary | ICD-10-CM | POA: Diagnosis not present

## 2021-03-22 DIAGNOSIS — K2270 Barrett's esophagus without dysplasia: Secondary | ICD-10-CM | POA: Diagnosis not present

## 2021-03-22 DIAGNOSIS — K31A19 Gastric intestinal metaplasia without dysplasia, unspecified site: Secondary | ICD-10-CM | POA: Diagnosis not present

## 2021-03-22 DIAGNOSIS — K5730 Diverticulosis of large intestine without perforation or abscess without bleeding: Secondary | ICD-10-CM | POA: Diagnosis not present

## 2021-03-22 DIAGNOSIS — K648 Other hemorrhoids: Secondary | ICD-10-CM | POA: Diagnosis not present

## 2021-10-11 DIAGNOSIS — M25561 Pain in right knee: Secondary | ICD-10-CM | POA: Diagnosis not present

## 2022-06-04 DIAGNOSIS — M109 Gout, unspecified: Secondary | ICD-10-CM | POA: Diagnosis not present

## 2022-06-04 DIAGNOSIS — N400 Enlarged prostate without lower urinary tract symptoms: Secondary | ICD-10-CM | POA: Diagnosis not present

## 2022-06-04 DIAGNOSIS — K219 Gastro-esophageal reflux disease without esophagitis: Secondary | ICD-10-CM | POA: Diagnosis not present

## 2022-06-04 DIAGNOSIS — Z5181 Encounter for therapeutic drug level monitoring: Secondary | ICD-10-CM | POA: Diagnosis not present

## 2022-06-04 DIAGNOSIS — E785 Hyperlipidemia, unspecified: Secondary | ICD-10-CM | POA: Diagnosis not present

## 2022-07-05 ENCOUNTER — Observation Stay (HOSPITAL_COMMUNITY)
Admission: EM | Admit: 2022-07-05 | Discharge: 2022-07-07 | Disposition: A | Discharge status: Home / Self Care | Payer: BC Managed Care – PPO | Attending: Internal Medicine | Admitting: Internal Medicine

## 2022-07-05 ENCOUNTER — Emergency Department (HOSPITAL_COMMUNITY): Payer: BC Managed Care – PPO

## 2022-07-05 ENCOUNTER — Encounter (HOSPITAL_COMMUNITY): Payer: Self-pay | Admitting: Internal Medicine

## 2022-07-05 ENCOUNTER — Other Ambulatory Visit: Payer: Self-pay

## 2022-07-05 DIAGNOSIS — R072 Precordial pain: Secondary | ICD-10-CM

## 2022-07-05 DIAGNOSIS — R7401 Elevation of levels of liver transaminase levels: Secondary | ICD-10-CM | POA: Diagnosis not present

## 2022-07-05 DIAGNOSIS — E782 Mixed hyperlipidemia: Secondary | ICD-10-CM | POA: Insufficient documentation

## 2022-07-05 DIAGNOSIS — R0789 Other chest pain: Secondary | ICD-10-CM | POA: Diagnosis not present

## 2022-07-05 DIAGNOSIS — R079 Chest pain, unspecified: Secondary | ICD-10-CM | POA: Diagnosis not present

## 2022-07-05 DIAGNOSIS — I249 Acute ischemic heart disease, unspecified: Secondary | ICD-10-CM | POA: Diagnosis not present

## 2022-07-05 DIAGNOSIS — R7989 Other specified abnormal findings of blood chemistry: Secondary | ICD-10-CM | POA: Insufficient documentation

## 2022-07-05 DIAGNOSIS — K219 Gastro-esophageal reflux disease without esophagitis: Secondary | ICD-10-CM | POA: Insufficient documentation

## 2022-07-05 LAB — CBC
HCT: 44.2 % (ref 39.0–52.0)
Hemoglobin: 15.3 g/dL (ref 13.0–17.0)
MCH: 30.7 pg (ref 26.0–34.0)
MCHC: 34.6 g/dL (ref 30.0–36.0)
MCV: 88.8 fL (ref 80.0–100.0)
Platelets: 213 10*3/uL (ref 150–400)
RBC: 4.98 MIL/uL (ref 4.22–5.81)
RDW: 12.3 % (ref 11.5–15.5)
WBC: 6.7 10*3/uL (ref 4.0–10.5)
nRBC: 0 % (ref 0.0–0.2)

## 2022-07-05 LAB — BASIC METABOLIC PANEL
Anion gap: 9 (ref 5–15)
BUN: 13 mg/dL (ref 6–20)
CO2: 26 mmol/L (ref 22–32)
Calcium: 9.6 mg/dL (ref 8.9–10.3)
Chloride: 105 mmol/L (ref 98–111)
Creatinine, Ser: 1.1 mg/dL (ref 0.61–1.24)
GFR, Estimated: >60 mL/min (ref >60)
Glucose, Bld: 99 mg/dL (ref 70–99)
Potassium: 3.7 mmol/L (ref 3.5–5.1)
Sodium: 140 mmol/L (ref 135–145)

## 2022-07-05 LAB — HEPATIC FUNCTION PANEL
ALT: 104 U/L — ABNORMAL HIGH (ref 0–44)
AST: 60 U/L — ABNORMAL HIGH (ref 15–41)
Albumin: 4.2 g/dL (ref 3.5–5.0)
Alkaline Phosphatase: 51 U/L (ref 38–126)
Bilirubin, Direct: 0.1 mg/dL (ref 0.0–0.2)
Indirect Bilirubin: 0.5 mg/dL (ref 0.3–0.9)
Total Bilirubin: 0.6 mg/dL (ref 0.3–1.2)
Total Protein: 7.4 g/dL (ref 6.5–8.1)

## 2022-07-05 LAB — TROPONIN I (HIGH SENSITIVITY)
Troponin I (High Sensitivity): 6 ng/L (ref <18)
Troponin I (High Sensitivity): 21 ng/L — ABNORMAL HIGH (ref <18)

## 2022-07-05 MED ORDER — ONDANSETRON HCL 4 MG PO TABS: 4.0000 mg | ORAL_TABLET | Freq: Four times a day (QID) | ORAL | Status: DC | PRN

## 2022-07-05 MED ORDER — PANTOPRAZOLE SODIUM 40 MG PO TBEC
40.0000 mg | DELAYED_RELEASE_TABLET | Freq: Every day | ORAL | Status: DC
  Administered 2022-07-06 – 2022-07-07 (×2): 40 mg via ORAL
  Filled 2022-07-05 (×2): qty 1

## 2022-07-05 MED ORDER — ACETAMINOPHEN 650 MG RE SUPP: 650.0000 mg | Freq: Four times a day (QID) | RECTAL | Status: DC | PRN

## 2022-07-05 MED ORDER — ASPIRIN 81 MG PO TBEC
81.0000 mg | DELAYED_RELEASE_TABLET | Freq: Every day | ORAL | Status: DC
  Administered 2022-07-06 – 2022-07-07 (×2): 81 mg via ORAL
  Filled 2022-07-05 (×2): qty 1

## 2022-07-05 MED ORDER — ONDANSETRON HCL 4 MG/2ML IJ SOLN: 4.0000 mg | Freq: Four times a day (QID) | INTRAMUSCULAR | Status: DC | PRN

## 2022-07-05 MED ORDER — HEPARIN BOLUS VIA INFUSION
4000.0000 [IU] | Freq: Once | INTRAVENOUS | Status: AC
  Administered 2022-07-05: 4000 [IU] via INTRAVENOUS
  Filled 2022-07-05: qty 4000

## 2022-07-05 MED ORDER — HEPARIN (PORCINE) 25000 UT/250ML-% IV SOLN
1350.0000 [IU]/h | INTRAVENOUS | Status: DC
  Administered 2022-07-05: 1000 [IU]/h via INTRAVENOUS
  Administered 2022-07-06: 1250 [IU]/h via INTRAVENOUS
  Filled 2022-07-05 (×2): qty 250

## 2022-07-05 MED ORDER — ASPIRIN 81 MG PO CHEW
324.0000 mg | CHEWABLE_TABLET | Freq: Once | ORAL | Status: AC
  Administered 2022-07-05: 324 mg via ORAL
  Filled 2022-07-05: qty 4

## 2022-07-05 MED ORDER — ACETAMINOPHEN 325 MG PO TABS: 650.0000 mg | ORAL_TABLET | Freq: Four times a day (QID) | ORAL | Status: DC | PRN

## 2022-07-05 MED ORDER — ROSUVASTATIN CALCIUM 5 MG PO TABS
10.0000 mg | ORAL_TABLET | Freq: Every day | ORAL | Status: DC
  Administered 2022-07-06: 10 mg via ORAL
  Filled 2022-07-05: qty 2

## 2022-07-05 MED ORDER — NITROGLYCERIN 0.4 MG SL SUBL: 0.4000 mg | SUBLINGUAL_TABLET | SUBLINGUAL | Status: DC | PRN

## 2022-07-05 NOTE — ED Triage Notes (Signed)
Patient here with compliant of sudden onset of chest tightness, fatigue, and jaw pain that started while walking a dog. Patient also states his mother died two days ago. Patient is alert, oriented, and in no apparent distress at this time.

## 2022-07-05 NOTE — ED Provider Triage Note (Signed)
Emergency Medicine Provider Triage Evaluation Note  Steven Huber , a 62 y.o. male  was evaluated in triage.  Pt complains of chest pain radiating to his jaw today.  Patient reports that he was out walking his dogs when towards any started to have this sudden fatigue with the chest pain rating to his jaw.  Patient reports he been under a lot of stress and his mother died 2 days ago.  He denies any real shortness of breath but mentions fatigue.  Reports that he feels fine now.  Review of Systems  Positive:  Negative:   Physical Exam  BP (!) 149/97   Pulse 70   Temp 98 F (36.7 C)   Resp 18   SpO2 96%  Gen:   Awake, no distress   Resp:  Normal effort  MSK:   Moves extremities without difficulty  Other:  Regular rate and rhythm.  Palpable radial pulses.  No acute distress.  No diaphoresis.  Medical Decision Making  Medically screening exam initiated at 4:28 PM.  Appropriate orders placed.  Steven Huber was informed that the remainder of the evaluation will be completed by another provider, this initial triage assessment does not replace that evaluation, and the importance of remaining in the ED until their evaluation is complete.  Chest pain labs.    Achille Rich, [place] 07/05/22 2376

## 2022-07-05 NOTE — ED Provider Notes (Addendum)
[facility] EMERGENCY DEPARTMENT Provider Note   CSN: [id number] Arrival date & time: 07/05/22  1603     History  Chief Complaint  Patient presents with   Chest Pain    Steven Huber is a 61 y.o. male.  Pt with c/o chest discomfort earlier today. Pt indicates had just walked his dogs, and then noted onset of discomfort in mid to left chest and left jaw area. Symptoms acute onset, moderate, dull. No hx same pain. +associated fatigue feeling. Denies sob. No clamminess or sweatiness. No palpitations. Denies any other recent chest pain or exertional chest discomfort. No fam hx cad. Denies cough or uri symptoms. No gi symptoms. No leg pain or swelling. Symptoms lasted ~ 5-10 minutes and are currently resolved. (Notes recent stress related to recent passing of mother, but denies feeling anxious/worried/stressed when earlier symptoms occurred).   The history is provided by the patient, medical records and the spouse.  Chest Pain Associated symptoms: no abdominal pain, no cough, no fever, no headache, no nausea, no palpitations, no shortness of breath and no vomiting        Home Medications Prior to Admission medications   Medication Sig Start Date End Date Taking? Authorizing Provider  aspirin EC 81 MG tablet Take 81 mg by mouth daily.    [provider]  colchicine 0.6 MG tablet 1.2 mg orally at the first sign of a flare followed by 0.6 mg one hour later 07/21/18   Wurst, [place], PA-C  CRESTOR 10 MG tablet Take 10 mg by mouth daily. 05/14/14   [provider]  fluticasone (FLONASE) 50 MCG/ACT nasal spray Place 1 spray into both nostrils daily.    [provider]  meloxicam (MOBIC) 15 MG tablet Take 1 tablet (15 mg total) by mouth daily. 07/21/18   Wurst, [place], PA-C  omeprazole (PRILOSEC) 20 MG capsule Take 20 mg by mouth daily.    [provider]      Allergies    Patient has no known allergies.    Review of Systems    Review of Systems  Constitutional:  Negative for fever.  HENT:  Negative for sore throat.   Eyes:  Negative for redness.  Respiratory:  Negative for cough and shortness of breath.   Cardiovascular:  Positive for chest pain. Negative for palpitations and leg swelling.  Gastrointestinal:  Negative for abdominal pain, nausea and vomiting.  Genitourinary:  Negative for flank pain.  Musculoskeletal:  Negative for neck pain.  Skin:  Negative for rash.  Neurological:  Negative for headaches.  Hematological:  Does not bruise/bleed easily.  Psychiatric/Behavioral:  Negative for confusion.     Physical Exam Updated Vital Signs BP (!) 149/97   Pulse 70   Temp 98 F (36.7 C)   Resp 18   SpO2 96%  Physical Exam Vitals and nursing note reviewed.  Constitutional:      Appearance: Normal appearance. He is well-developed.  HENT:     Head: Atraumatic.     Nose: Nose normal.     Mouth/Throat:     Mouth: Mucous membranes are moist.  Eyes:     General: No scleral icterus.    Conjunctiva/sclera: Conjunctivae normal.  Neck:     Trachea: No tracheal deviation.  Cardiovascular:     Rate and Rhythm: Normal rate and regular rhythm.     Pulses: Normal pulses.     Heart sounds: Normal heart sounds. No murmur heard.    No friction rub. No  gallop.  Pulmonary:     Effort: Pulmonary effort is normal. No accessory muscle usage or respiratory distress.     Breath sounds: Normal breath sounds.  Chest:     Chest wall: No tenderness.  Abdominal:     General: There is no distension.     Tenderness: There is no abdominal tenderness.  Musculoskeletal:        General: No swelling or tenderness.     Cervical back: Normal range of motion and neck supple.     Right lower leg: No edema.     Left lower leg: No edema.  Skin:    General: Skin is warm and dry.     Findings: No rash.  Neurological:     Mental Status: He is alert.     Comments: Alert, speech clear.   Psychiatric:        Mood and Affect:  Mood normal.     ED Results / Procedures / Treatments   Labs (all labs ordered are listed, but only abnormal results are displayed) Results for orders placed or performed during the hospital encounter of 07/05/22  Basic metabolic panel  Result Value Ref Range   Sodium 140 135 - 145 mmol/L   Potassium 3.7 3.5 - 5.1 mmol/L   Chloride 105 98 - 111 mmol/L   CO2 26 22 - 32 mmol/L   Glucose, Bld 99 70 - 99 mg/dL   BUN 13 6 - 20 mg/dL   Creatinine, Ser 3.66 0.61 - 1.24 mg/dL   Calcium 9.6 8.9 - 44.0 mg/dL   GFR, Estimated >34 >74 mL/min   Anion gap 9 5 - 15  CBC  Result Value Ref Range   WBC 6.7 4.0 - 10.5 K/uL   RBC 4.98 4.22 - 5.81 MIL/uL   Hemoglobin 15.3 13.0 - 17.0 g/dL   HCT 25.9 56.3 - 87.5 %   MCV 88.8 80.0 - 100.0 fL   MCH 30.7 26.0 - 34.0 pg   MCHC 34.6 30.0 - 36.0 g/dL   RDW 64.3 32.9 - 51.8 %   Platelets 213 150 - 400 K/uL   nRBC 0.0 0.0 - 0.2 %  Hepatic function panel  Result Value Ref Range   Total Protein 7.4 6.5 - 8.1 g/dL   Albumin 4.2 3.5 - 5.0 g/dL   AST 60 (H) 15 - 41 U/L   ALT 104 (H) 0 - 44 U/L   Alkaline Phosphatase 51 38 - 126 U/L   Total Bilirubin 0.6 0.3 - 1.2 mg/dL   Bilirubin, Direct 0.1 0.0 - 0.2 mg/dL   Indirect Bilirubin 0.5 0.3 - 0.9 mg/dL  Troponin I (High Sensitivity)  Result Value Ref Range   Troponin I (High Sensitivity) 6 <18 ng/L  Troponin I (High Sensitivity)  Result Value Ref Range   Troponin I (High Sensitivity) 21 (H) <18 ng/L   DG Chest 2 View  Result Date: 07/05/2022 CLINICAL DATA:  Chest pain EXAM: CHEST - 2 VIEW COMPARISON:  None Available. FINDINGS: The heart size and mediastinal contours are within normal limits. Both lungs are clear. The visualized skeletal structures are unremarkable. IMPRESSION: No active cardiopulmonary disease. Electronically Signed   By: Duanne Guess D.O.   On: 07/05/2022 16:52    EKG EKG Interpretation  Date/Time:  Saturday July 05 2022 16:11:23 EST Ventricular Rate:  70 PR  Interval:  192 QRS Duration: 84 QT Interval:  374 QTC Calculation: 403 R Axis:   97 Text Interpretation: Normal sinus rhythm Rightward axis No  previous ECGs available Confirmed by Cathren Laine ([id number]) on 07/05/2022 4:47:35 PM  Radiology DG Chest 2 View  Result Date: 07/05/2022 CLINICAL DATA:  Chest pain EXAM: CHEST - 2 VIEW COMPARISON:  None Available. FINDINGS: The heart size and mediastinal contours are within normal limits. Both lungs are clear. The visualized skeletal structures are unremarkable. IMPRESSION: No active cardiopulmonary disease. Electronically Signed   By: Duanne Guess D.O.   On: 07/05/2022 16:52    Procedures Procedures    Medications Ordered in ED Medications - No data to display  ED Course/ Medical Decision Making/ A&P                           Medical Decision Making Problems Addressed: ACS (acute coronary syndrome) [facility]): acute illness or injury with systemic symptoms that poses a threat to life or bodily functions Precordial chest pain: acute illness or injury with systemic symptoms that poses a threat to life or bodily functions  Amount and/or Complexity of Data Reviewed Independent Historian:     Details: Family, hx External Data Reviewed: notes. Labs: ordered. Decision-making details documented in ED Course. Radiology: ordered and independent interpretation performed. Decision-making details documented in ED Course. ECG/medicine tests: ordered and independent interpretation performed. Decision-making details documented in ED Course. Discussion of management or test interpretation with external provider(s): Card/med  Risk OTC drugs. Prescription drug management. Decision regarding hospitalization.   Iv ns. Continuous pulse ox and cardiac monitoring. Labs ordered/sent. Imaging ordered.   Diff dx includes acs, atypical cp, gi cp, msk cp, etc - dispo decision including potential need for admission considered - will get labs and imaging and  reassess.   Reviewed nursing notes and prior charts for additional history. External reports reviewed. Additional history from: family  Cardiac monitor: sinus rhythm, rate 70.  Labs reviewed/interpreted by me - wbc and hgb normal. Initial trop normal.  Symptoms remain resolved.   Xrays reviewed/interpreted by me - no pna.   Asa po. Pain remains resolved.   Additional labs reviewed/interpreted by me - delta trop 21, increased from initial and mildly high.  Pt remains cp free.   Given concerning symptoms/pain, bump in delta trop, will start heparin per pharmacy, consult cardiology and arrange hospital admission. Discussed pt with cardiology, Dr Mayford Knife, incl presentation/labs - agrees w heparin, indicates admit to hospitalists and have them consult cardiology in AM.  Hospitalists consulted.   CRITICAL CARE RE: precordial chest pain/ACS with elevated troponin Performed by: Suzi Roots Total critical care time: 40 minutes Critical care time was exclusive of separately billable procedures and treating other patients. Critical care was necessary to treat or prevent imminent or life-threatening deterioration. Critical care was time spent personally by me on the following activities: development of treatment plan with patient and/or surrogate as well as nursing, discussions with consultants, evaluation of patient's response to treatment, examination of patient, obtaining history from patient or surrogate, ordering and performing treatments and interventions, ordering and review of laboratory studies, ordering and review of radiographic studies, pulse oximetry and re-evaluation of patient's condition.           Final Clinical Impression(s) / ED Diagnoses Final diagnoses:  None    Rx / DC Orders ED Discharge Orders     None        Cathren Laine, MD 07/05/22 2019

## 2022-07-05 NOTE — ED Notes (Signed)
Received verbal report from Ashley S. RN at this time 

## 2022-07-05 NOTE — H&P (Incomplete)
History and Physical    Patient: Steven Huber FGH:[id number] DOB: Jul 16, 1960 DOA: 07/05/2022 DOS: the patient was seen and examined on 07/05/2022 PCP: Pcp, No  Patient coming from: Home  Chief Complaint:  Chief Complaint  Patient presents with   Chest Pain   HPI: Steven Huber is a 61 y.o. male with medical history significant of hyperlipidemia, GERD who presents to the emergency department earlier today due to sudden onset of chest discomfort which occurred while walking his dog.  Chest pain was mid to left chest as well as in the left jaw area, pain was dull in nature and this was associated with feeling fatigued.  ED denies shortness of breath, sweatiness, palpitations, any other recent chest pain or exertional chest discomfort, cough or URI symptoms.  Chest pain resolved within 5 to 10 minutes of onset. Patient endorsed recent stress due to recent mother's death, but denies depression or anxiety.  ED Course:  In the emergency department, BP was 149/97 and other vital signs were within normal range.  Workup in the ED showed normal CBC and BMP, AST 60, ALT 104, troponin 6 > 21 Chest x-ray showed no active cardiopulmonary disease Aspirin 324 mg was provided, cardiologist on-call (Dr. Mayford Knife) was consulted and recommended starting patient on heparin drip and admit patient to hospitalist service will goal to follow-up patient on consult per EDP.  Review of Systems: Review of systems as noted in the HPI. All other systems reviewed and are negative.   Past Medical History:  Diagnosis Date   GERD (gastroesophageal reflux disease)    Hyperlipidemia    Past Surgical History:  Procedure Laterality Date   LAPAROSCOPIC APPENDECTOMY N/A 05/26/2014   Procedure: APPENDECTOMY LAPAROSCOPIC;  Surgeon: [name], MD;  Location: [facility] OR;  Service: General;  Laterality: N/A;    Social History:  reports that he has never smoked. He has never used smokeless tobacco. He reports current alcohol  use. He reports that he does not use drugs.   No Known Allergies  Family History  Problem Relation Age of Onset   Hypertension Mother    Diabetes Mother     ***  Prior to Admission medications   Medication Sig Start Date End Date Taking? Authorizing Provider  aspirin EC 81 MG tablet Take 81 mg by mouth daily.    [provider]  colchicine 0.6 MG tablet 1.2 mg orally at the first sign of a flare followed by 0.6 mg one hour later 07/21/18   Wurst, [place], PA-C  CRESTOR 10 MG tablet Take 10 mg by mouth daily. 05/14/14   [provider]  fluticasone (FLONASE) 50 MCG/ACT nasal spray Place 1 spray into both nostrils daily.    [provider]  meloxicam (MOBIC) 15 MG tablet Take 1 tablet (15 mg total) by mouth daily. 07/21/18   Wurst, [place], PA-C  omeprazole (PRILOSEC) 20 MG capsule Take 20 mg by mouth daily.    [provider]    Physical Exam: BP (!) 147/85   Pulse 64   Temp 97.8 F (36.6 C) (Oral)   Resp 19   Ht 5\' 11"  (1.803 m)   Wt 83.9 kg   SpO2 97%   BMI 25.80 kg/m   General: 61 y.o. year-old male well developed well nourished in no acute distress.  Alert and oriented x3. HEENT: NCAT, EOMI Neck: Supple, trachea medial Cardiovascular: Regular rate and rhythm with no rubs or gallops.  No thyromegaly or JVD noted.  No lower extremity edema.  2/4 pulses in all 4 extremities. Respiratory: Clear to auscultation with no wheezes or rales. Good inspiratory effort. Abdomen: Soft, nontender nondistended with normal bowel sounds x4 quadrants. Muskuloskeletal: No cyanosis, clubbing or edema noted bilaterally Neuro: CN II-XII intact, strength 5/5 x 4, sensation, reflexes intact Skin: No ulcerative lesions noted or rashes Psychiatry: Judgement and insight appear normal. Mood is appropriate for condition and setting          Labs on Admission:  Basic Metabolic Panel: Recent Labs  Lab 07/05/22 1618  NA 140  K 3.7  CL 105  CO2 26  GLUCOSE 99   BUN 13  CREATININE 1.10  CALCIUM 9.6   Liver Function Tests: Recent Labs  Lab 07/05/22 1618  AST 60*  ALT 104*  ALKPHOS 51  BILITOT 0.6  PROT 7.4  ALBUMIN 4.2   No results for input(s): "LIPASE", "AMYLASE" in the last 168 hours. No results for input(s): "AMMONIA" in the last 168 hours. CBC: Recent Labs  Lab 07/05/22 1618  WBC 6.7  HGB 15.3  HCT 44.2  MCV 88.8  PLT 213   Cardiac Enzymes: No results for input(s): "CKTOTAL", "CKMB", "CKMBINDEX", "TROPONINI" in the last 168 hours.  BNP (last 3 results) No results for input(s): "BNP" in the last 8760 hours.  ProBNP (last 3 results) No results for input(s): "PROBNP" in the last 8760 hours.  CBG: No results for input(s): "GLUCAP" in the last 168 hours.  Radiological Exams on Admission: DG Chest 2 View  Result Date: 07/05/2022 CLINICAL DATA:  Chest pain EXAM: CHEST - 2 VIEW COMPARISON:  None Available. FINDINGS: The heart size and mediastinal contours are within normal limits. Both lungs are clear. The visualized skeletal structures are unremarkable. IMPRESSION: No active cardiopulmonary disease. Electronically Signed   By: Duanne Guess D.O.   On: 07/05/2022 16:52    EKG: I independently viewed the EKG done and my findings are as followed: Normal sinus rhythm at rate of 70 bpm  Assessment/Plan Present on Admission:  Chest pain  Principal Problem:   Chest pain Active Problems:   Elevated troponin   Transaminitis   Mixed hyperlipidemia   GERD (gastroesophageal reflux disease)  Chest pain rule out ACS  Atypical Chest Pain Elevated troponin possibly secondary to above r/o type II demand ischemia Continue telemetry  Troponins x2 - 6 > 21, continue to trend troponincontinue to trend troponin EKG showed normal sinus rhythm at rate of 70 bpm Patient is currently chest pain-free Cardiology was consulted and recommended. Heparin drip with goal to follow-up on consult per EDP    Aspirin 324 mg x 1 was given,  continue aspirin 81 mg, nitroglycerin prn  Transaminitis AST 60, ALT 104 Patient denies abdominal pain Continue to monitor liver enzymes  Hyperlipidemia Continue Crestor  GERD Continue Protonix   DVT prophylaxis: Heparin drip   Code Status: Full code   Family Communication: None at bedside   Consults: Cardiology by EDP  Severity of Illness: The appropriate patient status for this patient is OBSERVATION. Observation status is judged to be reasonable and necessary in order to provide the required intensity of service to ensure the patient's safety. The patient's presenting symptoms, physical exam findings, and initial radiographic and laboratory data in the context of their medical condition is felt to place them at decreased risk for further clinical deterioration. Furthermore, it is anticipated that the patient will be medically stable for discharge from the hospital within 2 midnights of admission.   Author: Frankey Shown, DO 07/05/2022  9:05 PM  For on call review [email or web address].

## 2022-07-05 NOTE — Progress Notes (Signed)
ANTICOAGULATION CONSULT NOTE - Initial Consult  Pharmacy Consult for heparin  Indication: chest pain/ACS  No Known Allergies  Patient Measurements: Height: 5\' 11"  (180.3 cm) Weight: 83.9 kg (185 lb) IBW/kg (Calculated) : 75.3 Heparin Dosing Weight: 83.9  Vital Signs: Temp: 97.8 F (36.6 C) (12/30 2039) Temp Source: Oral (12/30 2039) BP: 147/85 (12/30 2015) Pulse Rate: 64 (12/30 2015)  Labs: Recent Labs    07/05/22 1618 07/05/22 1818  HGB 15.3  --   HCT 44.2  --   PLT 213  --   CREATININE 1.10  --   TROPONINIHS 6 21*    Estimated Creatinine Clearance: 77 mL/min (by C-G formula based on SCr of 1.1 mg/dL).   Medical History: Past Medical History:  Diagnosis Date   GERD (gastroesophageal reflux disease)    Hyperlipidemia     Assessment: Patient presented with CC of chest pain. Trop elevated to 21. Not on anticoagulation PTA. Pharmacy consulted to dose heparin.   Goal of Therapy:  Heparin level 0.3-0.7 units/ml Monitor platelets by anticoagulation protocol: Yes   Plan:  Give 4000 units bolus x 1 Start heparin infusion at 1000 units/hr Check anti-Xa level in 6 hours and daily while on heparin Continue to monitor H&H and platelets  07/07/22, PharmD, BCCCP  07/05/2022,8:46 PM

## 2022-07-06 ENCOUNTER — Observation Stay (HOSPITAL_BASED_OUTPATIENT_CLINIC_OR_DEPARTMENT_OTHER): Payer: BC Managed Care – PPO

## 2022-07-06 DIAGNOSIS — R079 Chest pain, unspecified: Secondary | ICD-10-CM | POA: Diagnosis not present

## 2022-07-06 LAB — TROPONIN I (HIGH SENSITIVITY)
Troponin I (High Sensitivity): 37 ng/L — ABNORMAL HIGH (ref <18)
Troponin I (High Sensitivity): 41 ng/L — ABNORMAL HIGH (ref <18)
Troponin I (High Sensitivity): 44 ng/L — ABNORMAL HIGH (ref <18)

## 2022-07-06 LAB — CBC
HCT: 40.4 % (ref 39.0–52.0)
Hemoglobin: 14.6 g/dL (ref 13.0–17.0)
MCH: 31.7 pg (ref 26.0–34.0)
MCHC: 36.1 g/dL — ABNORMAL HIGH (ref 30.0–36.0)
MCV: 87.6 fL (ref 80.0–100.0)
Platelets: 190 10*3/uL (ref 150–400)
RBC: 4.61 MIL/uL (ref 4.22–5.81)
RDW: 12.6 % (ref 11.5–15.5)
WBC: 6.5 10*3/uL (ref 4.0–10.5)
nRBC: 0 % (ref 0.0–0.2)

## 2022-07-06 LAB — HEPARIN LEVEL (UNFRACTIONATED)
Heparin Unfractionated: 0.21 IU/mL — ABNORMAL LOW (ref 0.30–0.70)
Heparin Unfractionated: 0.26 IU/mL — ABNORMAL LOW (ref 0.30–0.70)
Heparin Unfractionated: 0.33 IU/mL (ref 0.30–0.70)
Heparin Unfractionated: 0.3 IU/mL (ref 0.30–0.70)

## 2022-07-06 LAB — COMPREHENSIVE METABOLIC PANEL
ALT: 105 U/L — ABNORMAL HIGH (ref 0–44)
AST: 67 U/L — ABNORMAL HIGH (ref 15–41)
Albumin: 3.7 g/dL (ref 3.5–5.0)
Alkaline Phosphatase: 49 U/L (ref 38–126)
Anion gap: 7 (ref 5–15)
BUN: 13 mg/dL (ref 6–20)
CO2: 25 mmol/L (ref 22–32)
Calcium: 9.3 mg/dL (ref 8.9–10.3)
Chloride: 105 mmol/L (ref 98–111)
Creatinine, Ser: 1.03 mg/dL (ref 0.61–1.24)
GFR, Estimated: >60 mL/min (ref >60)
Glucose, Bld: 114 mg/dL — ABNORMAL HIGH (ref 70–99)
Potassium: 3.7 mmol/L (ref 3.5–5.1)
Sodium: 137 mmol/L (ref 135–145)
Total Bilirubin: 0.6 mg/dL (ref 0.3–1.2)
Total Protein: 6.7 g/dL (ref 6.5–8.1)

## 2022-07-06 LAB — LIPID PANEL
Cholesterol: 184 mg/dL (ref 0–200)
HDL: 57 mg/dL (ref >40)
LDL Cholesterol: 100 mg/dL — ABNORMAL HIGH (ref 0–99)
Total CHOL/HDL Ratio: 3.2 RATIO
Triglycerides: 133 mg/dL (ref <150)
VLDL: 27 mg/dL (ref 0–40)

## 2022-07-06 LAB — ECHOCARDIOGRAM COMPLETE
AR max vel: 3.87 cm2
AV Area VTI: 3.82 cm2
AV Area mean vel: 3.64 cm2
AV Mean grad: 3 mmHg
AV Peak grad: 5.3 mmHg
Ao pk vel: 1.15 m/s
Area-P 1/2: 3.03 cm2
Height: 71 in
S' Lateral: 2.5 cm
Weight: 3019.2 oz

## 2022-07-06 LAB — HEPATITIS PANEL, ACUTE
HCV Ab: NONREACTIVE
Hep A IgM: NONREACTIVE
Hep B C IgM: NONREACTIVE
Hepatitis B Surface Ag: NONREACTIVE

## 2022-07-06 LAB — HIV ANTIBODY (ROUTINE TESTING W REFLEX): HIV Screen 4th Generation wRfx: NONREACTIVE

## 2022-07-06 MED ORDER — MELATONIN 3 MG PO TABS
3.0000 mg | ORAL_TABLET | Freq: Every day | ORAL | Status: DC
  Administered 2022-07-06: 3 mg via ORAL
  Filled 2022-07-06: qty 1

## 2022-07-06 NOTE — Progress Notes (Signed)
ANTICOAGULATION CONSULT NOTE  Pharmacy Consult for heparin  Indication: chest pain/ACS  No Known Allergies  Patient Measurements: Height: 5\' 11"  (180.3 cm) Weight: 85.6 kg (188 lb 11.2 oz) IBW/kg (Calculated) : 75.3 Heparin Dosing Weight: 83.9  Vital Signs: Temp: 98.4 F (36.9 C) (12/31 1300) Temp Source: Oral (12/31 1300) BP: 126/86 (12/31 1300) Pulse Rate: 69 (12/31 1300)  Labs: Recent Labs    07/05/22 1618 07/05/22 1818 07/06/22 0005 07/06/22 0319 07/06/22 0701 07/06/22 1107 07/06/22 1751  HGB 15.3  --   --   --  14.6  --   --   HCT 44.2  --   --   --  40.4  --   --   PLT 213  --   --   --  190  --   --   HEPARINUNFRC  --   --   --    < > 0.26* 0.33 0.30  CREATININE 1.10  --   --   --  1.03  --   --   TROPONINIHS 6   < > 44*  --  41* 37*  --    < > = values in this interval not displayed.     Estimated Creatinine Clearance: 82.2 mL/min (by C-G formula based on SCr of 1.03 mg/dL).  Assessment: 61 y.o. male with chest pain for heparin.   Heparin level therapeutic at 0.30 on 1250 units/hr  Goal of Therapy:  Heparin level 0.3-0.7 units/ml Monitor platelets by anticoagulation protocol: Yes   Plan:  -Increase heparin to 1350 units/hr to keep in goal -Heparin level and CBC daily.   46, PharmD Clinical Pharmacist **Pharmacist phone directory can now be found on [email or web address] ([email or web address]).  Listed under [facility].  e

## 2022-07-06 NOTE — Progress Notes (Signed)
ANTICOAGULATION CONSULT NOTE  Pharmacy Consult for heparin  Indication: chest pain/ACS Brief A/P: Heparin level subtherapeutic Increase Heparin rate  No Known Allergies  Patient Measurements: Height: 5\' 11"  (180.3 cm) Weight: 85.6 kg (188 lb 11.2 oz) IBW/kg (Calculated) : 75.3 Heparin Dosing Weight: 83.9  Vital Signs: Temp: 97.9 F (36.6 C) (12/31 0823) Temp Source: Oral (12/31 0823) BP: 124/86 (12/31 0823) Pulse Rate: 56 (12/31 0823)  Labs: Recent Labs    07/05/22 1618 07/05/22 1818 07/06/22 0005 07/06/22 0319 07/06/22 0701 07/06/22 1107  HGB 15.3  --   --   --  14.6  --   HCT 44.2  --   --   --  40.4  --   PLT 213  --   --   --  190  --   HEPARINUNFRC  --   --   --  0.21* 0.26* 0.33  CREATININE 1.10  --   --   --  1.03  --   TROPONINIHS 6 21* 44*  --  41*  --      Estimated Creatinine Clearance: 82.2 mL/min (by C-G formula based on SCr of 1.03 mg/dL).  Assessment: 61 y.o. male with chest pain for heparin.   Heparin level therapeutic at 0.33. CBC stable.  No issues with infusion or bleeding noted.   Goal of Therapy:  Heparin level 0.3-0.7 units/ml Monitor platelets by anticoagulation protocol: Yes   Plan:  Continue heparin 1250 units/hr Check heparin level in 6 hours  Check heparin level, CBC daily  Monitor for s/sx of bleeding   46, PharmD Pharmacy Resident  07/06/2022 11:55 AM

## 2022-07-06 NOTE — Progress Notes (Signed)
  Echocardiogram 2D Echocardiogram has been performed.  Gerda Diss 07/06/2022, 12:17 PM

## 2022-07-06 NOTE — Progress Notes (Signed)
ANTICOAGULATION CONSULT NOTE  Pharmacy Consult for heparin  Indication: chest pain/ACS Brief A/P: Heparin level subtherapeutic Increase Heparin rate  No Known Allergies  Patient Measurements: Height: 5\' 11"  (180.3 cm) Weight: 83.9 kg (185 lb) IBW/kg (Calculated) : 75.3 Heparin Dosing Weight: 83.9  Vital Signs: Temp: 98.4 F (36.9 C) (12/31 0340) Temp Source: Oral (12/31 0340) BP: 108/75 (12/31 0340) Pulse Rate: 63 (12/31 0340)  Labs: Recent Labs    07/05/22 1618 07/05/22 1818 07/06/22 0005 07/06/22 0319  HGB 15.3  --   --   --   HCT 44.2  --   --   --   PLT 213  --   --   --   HEPARINUNFRC  --   --   --  0.21*  CREATININE 1.10  --   --   --   TROPONINIHS 6 21* 44*  --      Estimated Creatinine Clearance: 77 mL/min (by C-G formula based on SCr of 1.1 mg/dL).  Assessment: 61 y.o. male with chest pain for heparin   Goal of Therapy:  Heparin level 0.3-0.7 units/ml Monitor platelets by anticoagulation protocol: Yes   Plan:  Increase Heparin 1250 units/hr Check heparin level in 8 hours.  46, PharmD, BCPS  07/06/2022,5:07 AM

## 2022-07-06 NOTE — Progress Notes (Signed)
PROGRESS NOTE    Steven Huber [facility]  NGE:[id number] DOB: Oct 03, 1960 DOA: 07/05/2022 PCP: Oneita Hurt, No   Brief Narrative:    Steven Huber is a 61 y.o. male with medical history significant of hyperlipidemia, GERD who presents to the emergency department earlier today due to sudden onset of chest discomfort which occurred while walking his dog.  He was admitted for atypical chest pain with plans to rule out ACS.  He is currently on heparin infusion with recommendations to remain on this per cardiology and have further evaluation with 2D echocardiogram and determine further assessment based on these results.  Assessment & Plan:   Principal Problem:   Chest pain Active Problems:   Elevated troponin   Transaminitis   Mixed hyperlipidemia   GERD (gastroesophageal reflux disease)  Assessment and Plan:   Atypical Chest Pain suggestive of angina. Elevated troponin with flat trend. Troponins x2 - 6 > 21, continue to trend troponincontinue to trend troponin Appreciate cardiology recommendations to continue monitoring on IV heparin drip for now 2D echocardiogram   Transaminitis AST 60, ALT 104 Patient denies abdominal pain Continue to monitor liver enzymes Obtain acute hepatitis panel   Hyperlipidemia Hold Crestor for now given transaminitis   GERD Continue Protonix    DVT prophylaxis: Heparin drip Code Status: Full Family Communication: None at bedside Disposition Plan:  Status is: Observation The patient will require care spanning > 2 midnights and should be moved to inpatient because: Need for IV infusion.  Consultants:  Cardiology  Procedures:  None  Antimicrobials:  None   Subjective: Patient seen and evaluated today with no new acute complaints or concerns. No acute concerns or events noted overnight.  He denies any further chest pain.  Objective: Vitals:   07/06/22 0300 07/06/22 0340 07/06/22 0639 07/06/22 0823  BP: 108/75 108/75 (!) 134/98 124/86  Pulse: (!)  56 63 65 (!) 56  Resp: 12 16 16 16   Temp:  98.4 F (36.9 C) 98.1 F (36.7 C) 97.9 F (36.6 C)  TempSrc:  Oral Oral Oral  SpO2: 96% 95% 96% 94%  Weight:   85.6 kg   Height:   5\' 11"  (1.803 m)    No intake or output data in the 24 hours ending 07/06/22 1139 Filed Weights   07/05/22 2039 07/06/22 0639  Weight: 83.9 kg 85.6 kg    Examination:  General exam: Appears calm and comfortable  Respiratory system: Clear to auscultation. Respiratory effort normal. Cardiovascular system: S1 & S2 heard, RRR.  Gastrointestinal system: Abdomen is soft Central nervous system: Alert and awake Extremities: No edema Skin: No significant lesions noted Psychiatry: Flat affect.    Data Reviewed: I have personally reviewed following labs and imaging studies  CBC: Recent Labs  Lab 07/05/22 1618 07/06/22 0701  WBC 6.7 6.5  HGB 15.3 14.6  HCT 44.2 40.4  MCV 88.8 87.6  PLT 213 190   Basic Metabolic Panel: Recent Labs  Lab 07/05/22 1618 07/06/22 0701  NA 140 137  K 3.7 3.7  CL 105 105  CO2 26 25  GLUCOSE 99 114*  BUN 13 13  CREATININE 1.10 1.03  CALCIUM 9.6 9.3   GFR: Estimated Creatinine Clearance: 82.2 mL/min (by C-G formula based on SCr of 1.03 mg/dL). Liver Function Tests: Recent Labs  Lab 07/05/22 1618 07/06/22 0701  AST 60* 67*  ALT 104* 105*  ALKPHOS 51 49  BILITOT 0.6 0.6  PROT 7.4 6.7  ALBUMIN 4.2 3.7   No results for input(s): "LIPASE", "AMYLASE"  in the last 168 hours. No results for input(s): "AMMONIA" in the last 168 hours. Coagulation Profile: No results for input(s): "INR", "PROTIME" in the last 168 hours. Cardiac Enzymes: No results for input(s): "CKTOTAL", "CKMB", "CKMBINDEX", "TROPONINI" in the last 168 hours. BNP (last 3 results) No results for input(s): "PROBNP" in the last 8760 hours. HbA1C: No results for input(s): "HGBA1C" in the last 72 hours. CBG: No results for input(s): "GLUCAP" in the last 168 hours. Lipid Profile: No results for  input(s): "CHOL", "HDL", "LDLCALC", "TRIG", "CHOLHDL", "LDLDIRECT" in the last 72 hours. Thyroid Function Tests: No results for input(s): "TSH", "T4TOTAL", "FREET4", "T3FREE", "THYROIDAB" in the last 72 hours. Anemia Panel: No results for input(s): "VITAMINB12", "FOLATE", "FERRITIN", "TIBC", "IRON", "RETICCTPCT" in the last 72 hours. Sepsis Labs: No results for input(s): "PROCALCITON", "LATICACIDVEN" in the last 168 hours.  No results found for this or any previous visit (from the past 240 hour(s)).       Radiology Studies: DG Chest 2 View  Result Date: 07/05/2022 CLINICAL DATA:  Chest pain EXAM: CHEST - 2 VIEW COMPARISON:  None Available. FINDINGS: The heart size and mediastinal contours are within normal limits. Both lungs are clear. The visualized skeletal structures are unremarkable. IMPRESSION: No active cardiopulmonary disease. Electronically Signed   By: Duanne Guess D.O.   On: 07/05/2022 16:52        Scheduled Meds:  aspirin EC  81 mg Oral Daily   pantoprazole  40 mg Oral Daily   rosuvastatin  10 mg Oral Daily   Continuous Infusions:  heparin 1,250 Units/hr (07/06/22 0510)     LOS: 0 days    Time spent: 35 minutes    Tashan Kreitzer Hoover Brunette, DO [facility]  If 7PM-7AM, please contact night-coverage [email or web address] 07/06/2022, 11:39 AM

## 2022-07-06 NOTE — Consult Note (Addendum)
Cardiology Consultation   Patient ID: Steven Huber MRN: [id number]; DOB: 07-21-1960  Admit date: 07/05/2022 Date of Consult: 07/06/2022  PCP:  Steven Huber, No   [facility] Providers Cardiologist:  None        Patient Profile:   Steven Huber is a 61 y.o. male with hx of HLD who is being seen 07/06/2022 for the evaluation of chest pain at the request of Dr. Sherryll Burger.  History of Present Illness:   Steven Huber reports that yesterday he was walking his dog and he felt a pain his L jaw as well as sternal CP. He felt fatigued. He leaned against his car and called his son. His son reviewed his symptoms and was concerned about an MI. He also reports that his mother recently passed away and he was driving back and forth to [place] to aid in her transition. He has no hx of cardiac dx. Non smoker. He is on crestor at home. He has no family hx or premature CAD.   Here he is CP free. Normal vitals. EKG did not show ST changes. Trop is flat 21, 41, 44. Renal fxn is normal.  Hgb is normal. Cxray is unremarkable.   Past Medical History:  Diagnosis Date   GERD (gastroesophageal reflux disease)    Hyperlipidemia     Past Surgical History:  Procedure Laterality Date   LAPAROSCOPIC APPENDECTOMY N/A 05/26/2014   Procedure: APPENDECTOMY LAPAROSCOPIC;  Surgeon: Steven Rudd, MD;  Location: [facility] OR;  Service: General;  Laterality: N/A;     Home Medications:  Prior to Admission medications   Medication Sig Start Date End Date Taking? Authorizing Provider  aspirin EC 81 MG tablet Take 81 mg by mouth daily.   Yes [provider]  CRESTOR 10 MG tablet Take 10 mg by mouth daily. 05/14/14  Yes [provider]  fluticasone (FLONASE) 50 MCG/ACT nasal spray Place 1 spray into both nostrils daily.   Yes [provider]  omeprazole (PRILOSEC) 20 MG capsule Take 20 mg by mouth daily.   Yes [provider]  colchicine 0.6 MG tablet 1.2 mg orally at the first sign of a  flare followed by 0.6 mg one hour later Patient not taking: Reported on 07/05/2022 07/21/18   Huber, [place], PA-C  meloxicam (MOBIC) 15 MG tablet Take 1 tablet (15 mg total) by mouth daily. Patient not taking: Reported on 07/05/2022 07/21/18   Rennis Harding, PA-C    Inpatient Medications: Scheduled Meds:  aspirin EC  81 mg Oral Daily   pantoprazole  40 mg Oral Daily   rosuvastatin  10 mg Oral Daily   Continuous Infusions:  heparin 1,250 Units/hr (07/06/22 0510)   PRN Meds: acetaminophen **OR** acetaminophen, nitroGLYCERIN, ondansetron **OR** ondansetron (ZOFRAN) IV  Allergies:   No Known Allergies  Social History:   Social History   Socioeconomic History   Marital status: Married    Spouse name: Not on file   Number of children: Not on file   Years of education: Not on file   Highest education level: Not on file  Occupational History   Not on file  Tobacco Use   Smoking status: Never   Smokeless tobacco: Never  Substance and Sexual Activity   Alcohol use: Yes    Comment: few beers a night at least 4 night a week.   Drug use: No   Sexual activity: Yes  Other Topics Concern   Not on file  Social History Narrative   Not  on file   Social Determinants of Health   Financial Resource Strain: Not on file  Food Insecurity: Not on file  Transportation Needs: Not on file  Physical Activity: Not on file  Stress: Not on file  Social Connections: Not on file  Intimate Partner Violence: Not on file    Family History:    Family History  Problem Relation Age of Onset   Hypertension Mother    Diabetes Mother      ROS:  Please see the history of present illness.  All other ROS reviewed and negative.     Physical Exam/Data:   Vitals:   07/06/22 0300 07/06/22 0340 07/06/22 0639 07/06/22 0823  BP: 108/75 108/75 (!) 134/98 124/86  Pulse: (!) 56 63 65 (!) 56  Resp: 12 16 16 16   Temp:  98.4 F (36.9 C) 98.1 F (36.7 C) 97.9 F (36.6 C)  TempSrc:  Oral Oral Oral   SpO2: 96% 95% 96% 94%  Weight:   85.6 kg   Height:   5\' 11"  (1.803 m)    No intake or output data in the 24 hours ending 07/06/22 1032    07/06/2022    6:39 AM 07/05/2022    8:39 PM 05/25/2018    9:23 AM  Last 3 Weights  Weight (lbs) 188 lb 11.2 oz 185 lb 185 lb  Weight (kg) 85.594 kg 83.915 kg 83.915 kg     Body mass index is 26.32 kg/m.   General:  Well nourished, well developed, in no acute distress HEENT: normal Neck: no JVD Cardiac:  normal S1, S2; RRR; no murmur  Lungs:  clear to auscultation bilaterally, no wheezing, rhonchi or rales  Abd: soft, nontender, no hepatomegaly  Ext: no edema Musculoskeletal:  No deformities, BUE and BLE strength normal and equal Skin: warm and dry  Neuro:  CNs 2-12 intact, no focal abnormalities noted Psych:  Normal affect   EKG:  The EKG was personally reviewed and demonstrates:  NSR, no ischemic changes  Telemetry:  Telemetry was personally reviewed and demonstrates:  NSR  Relevant CV Studies: Pending echo  Laboratory Data:  High Sensitivity Troponin:   Recent Labs  Lab 07/05/22 1618 07/05/22 1818 07/06/22 0005 07/06/22 0701  TROPONINIHS 6 21* 44* 41*     Chemistry Recent Labs  Lab 07/05/22 1618 07/06/22 0701  NA 140 137  K 3.7 3.7  CL 105 105  CO2 26 25  GLUCOSE 99 114*  BUN 13 13  CREATININE 1.10 1.03  CALCIUM 9.6 9.3  GFRNONAA >60 >60  ANIONGAP 9 7    Recent Labs  Lab 07/05/22 1618 07/06/22 0701  PROT 7.4 6.7  ALBUMIN 4.2 3.7  AST 60* 67*  ALT 104* 105*  ALKPHOS 51 49  BILITOT 0.6 0.6   Lipids No results for input(s): "CHOL", "TRIG", "HDL", "LABVLDL", "LDLCALC", "CHOLHDL" in the last 168 hours.  Hematology Recent Labs  Lab 07/05/22 1618 07/06/22 0701  WBC 6.7 6.5  RBC 4.98 4.61  HGB 15.3 14.6  HCT 44.2 40.4  MCV 88.8 87.6  MCH 30.7 31.7  MCHC 34.6 36.1*  RDW 12.3 12.6  PLT 213 190   Thyroid No results for input(s): "TSH", "FREET4" in the last 168 hours.  BNPNo results for input(s):  "BNP", "PROBNP" in the last 168 hours.  DDimer No results for input(s): "DDIMER" in the last 168 hours.   Radiology/Studies:  DG Chest 2 View  Result Date: 07/05/2022 CLINICAL DATA:  Chest pain EXAM: CHEST - 2 VIEW  COMPARISON:  None Available. FINDINGS: The heart size and mediastinal contours are within normal limits. Both lungs are clear. The visualized skeletal structures are unremarkable. IMPRESSION: No active cardiopulmonary disease. Electronically Signed   By: Duanne Guess D.O.   On: 07/05/2022 16:52     Assessment and Plan:   Angina CCS II: he has flat troponins and no EKG changes. However he had typical chest pain radiating to the jaw after walking his dog, has also had a stressful trigger (mom passed). He has no risk factors for CAD, except age and hx of HLD.  Will plan to get an echocardiogram as well as CVD risk stratification labs. Afterwards will determine LHC vs. outpatient stress (both will not occur until after the holiday). Can observe for another day, can continue heparin gtt for now.    Risk Assessment/Risk Scores:      Time Spent Directly with Patient:  I have spent a total of 60 minutes with the patient reviewing hospital notes, telemetry, EKGs, labs and examining the patient as well as establishing an assessment and plan that was discussed personally with the patient.  > 50% of time was spent in direct patient care.    For questions or updates, please contact [facility] Please consult [email or web address] for contact info under    Signed, [name], MD  07/06/2022 10:32 AM

## 2022-07-07 DIAGNOSIS — R079 Chest pain, unspecified: Secondary | ICD-10-CM | POA: Diagnosis not present

## 2022-07-07 DIAGNOSIS — R7989 Other specified abnormal findings of blood chemistry: Secondary | ICD-10-CM | POA: Diagnosis not present

## 2022-07-07 DIAGNOSIS — R072 Precordial pain: Secondary | ICD-10-CM | POA: Diagnosis not present

## 2022-07-07 DIAGNOSIS — R7401 Elevation of levels of liver transaminase levels: Secondary | ICD-10-CM | POA: Diagnosis not present

## 2022-07-07 LAB — COMPREHENSIVE METABOLIC PANEL
ALT: 111 U/L — ABNORMAL HIGH (ref 0–44)
AST: 66 U/L — ABNORMAL HIGH (ref 15–41)
Albumin: 3.5 g/dL (ref 3.5–5.0)
Alkaline Phosphatase: 52 U/L (ref 38–126)
Anion gap: 8 (ref 5–15)
BUN: 11 mg/dL (ref 6–20)
CO2: 27 mmol/L (ref 22–32)
Calcium: 9.3 mg/dL (ref 8.9–10.3)
Chloride: 101 mmol/L (ref 98–111)
Creatinine, Ser: 1.14 mg/dL (ref 0.61–1.24)
GFR, Estimated: >60 mL/min (ref >60)
Glucose, Bld: 119 mg/dL — ABNORMAL HIGH (ref 70–99)
Potassium: 3.9 mmol/L (ref 3.5–5.1)
Sodium: 136 mmol/L (ref 135–145)
Total Bilirubin: 0.7 mg/dL (ref 0.3–1.2)
Total Protein: 6.5 g/dL (ref 6.5–8.1)

## 2022-07-07 LAB — CBC
HCT: 39.5 % (ref 39.0–52.0)
Hemoglobin: 14.2 g/dL (ref 13.0–17.0)
MCH: 31.8 pg (ref 26.0–34.0)
MCHC: 35.9 g/dL (ref 30.0–36.0)
MCV: 88.6 fL (ref 80.0–100.0)
Platelets: 175 10*3/uL (ref 150–400)
RBC: 4.46 MIL/uL (ref 4.22–5.81)
RDW: 12.3 % (ref 11.5–15.5)
WBC: 6.5 10*3/uL (ref 4.0–10.5)
nRBC: 0 % (ref 0.0–0.2)

## 2022-07-07 LAB — HEPARIN LEVEL (UNFRACTIONATED): Heparin Unfractionated: 0.34 IU/mL (ref 0.30–0.70)

## 2022-07-07 LAB — MAGNESIUM: Magnesium: 2.1 mg/dL (ref 1.7–2.4)

## 2022-07-07 MED ORDER — NITROGLYCERIN 0.4 MG SL SUBL: 0.4000 mg | SUBLINGUAL_TABLET | SUBLINGUAL | 0 refills | Status: DC | PRN

## 2022-07-07 NOTE — Progress Notes (Signed)
Pt discharge order placed by attending. Pt stable for discharge.

## 2022-07-07 NOTE — Progress Notes (Signed)
Discharge instructions reviewed with pt.  Copy of instructions given to pt. Pt informed 1 new script sent to his pharmacy for pick up. New script NTG SL, pt educated on how to take if needed for chest pain and when to call 911. Pt verbalized understanding and able to teach back.   Pt to be d/c'd via wheelchair with belongings, with wife picking pt up at main entrance.          To be Escorted by staff.

## 2022-07-07 NOTE — Progress Notes (Addendum)
Rounding Note    Patient Name: Steven Huber [facility] Date of Encounter: 07/07/2022  [facility] Cardiologist: None , Carolan Clines MD, MS  Subjective   He feels well this AM. No recurrence of chest pain  Inpatient Medications    Scheduled Meds:  aspirin EC  81 mg Oral Daily   melatonin  3 mg Oral QHS   pantoprazole  40 mg Oral Daily   Continuous Infusions:  heparin 1,350 Units/hr (07/06/22 2101)   PRN Meds: acetaminophen **OR** acetaminophen, nitroGLYCERIN, ondansetron **OR** ondansetron (ZOFRAN) IV   Vital Signs    Vitals:   07/06/22 2040 07/07/22 0019 07/07/22 0431 07/07/22 0820  BP: 124/83 121/83 (!) 136/96 (!) 132/92  Pulse: 70 64 62 (!) 56  Resp: 17 18 20 14   Temp: 98.3 F (36.8 C) 98 F (36.7 C) 98.1 F (36.7 C) 98 F (36.7 C)  TempSrc: Oral Oral Oral Oral  SpO2: 96% 96% 95% 95%  Weight:      Height:       No intake or output data in the 24 hours ending 07/07/22 0942    07/06/2022    6:39 AM 07/05/2022    8:39 PM 05/25/2018    9:23 AM  Last 3 Weights  Weight (lbs) 188 lb 11.2 oz 185 lb 185 lb  Weight (kg) 85.594 kg 83.915 kg 83.915 kg      Telemetry    NSR - Personally Reviewed  ECG    No new - Personally Reviewed  Physical Exam   Vitals:   07/07/22 0431 07/07/22 0820  BP: (!) 136/96 (!) 132/92  Pulse: 62 (!) 56  Resp: 20 14  Temp: 98.1 F (36.7 C) 98 F (36.7 C)  SpO2: 95% 95%    GEN: No acute distress.   Neck: No JVD Cardiac: RRR, no murmurs, rubs, or gallops.  Respiratory: Clear to auscultation bilaterally. GI: Soft, nontender, non-distended  MS: No edema; No deformity. Neuro:  Nonfocal  Psych: Normal affect   Labs    High Sensitivity Troponin:   Recent Labs  Lab 07/05/22 1618 07/05/22 1818 07/06/22 0005 07/06/22 0701 07/06/22 1107  TROPONINIHS 6 21* 44* 41* 37*     Chemistry Recent Labs  Lab 07/05/22 1618 07/06/22 0701 07/07/22 0145  NA 140 137 136  K 3.7 3.7 3.9  CL 105 105 101  CO2 26 25 27    GLUCOSE 99 114* 119*  BUN 13 13 11   CREATININE 1.10 1.03 1.14  CALCIUM 9.6 9.3 9.3  MG  --   --  2.1  PROT 7.4 6.7 6.5  ALBUMIN 4.2 3.7 3.5  AST 60* 67* 66*  ALT 104* 105* 111*  ALKPHOS 51 49 52  BILITOT 0.6 0.6 0.7  GFRNONAA >60 >60 >60  ANIONGAP 9 7 8     Lipids  Recent Labs  Lab 07/06/22 1107  CHOL 184  TRIG 133  HDL 57  LDLCALC 100*  CHOLHDL 3.2    Hematology Recent Labs  Lab 07/05/22 1618 07/06/22 0701 07/07/22 0145  WBC 6.7 6.5 6.5  RBC 4.98 4.61 4.46  HGB 15.3 14.6 14.2  HCT 44.2 40.4 39.5  MCV 88.8 87.6 88.6  MCH 30.7 31.7 31.8  MCHC 34.6 36.1* 35.9  RDW 12.3 12.6 12.3  PLT 213 190 175   Thyroid No results for input(s): "TSH", "FREET4" in the last 168 hours.  BNPNo results for input(s): "BNP", "PROBNP" in the last 168 hours.  DDimer No results for input(s): "DDIMER" in the last 168  hours.   Radiology    ECHOCARDIOGRAM COMPLETE  Result Date: 07/06/2022    ECHOCARDIOGRAM REPORT   Patient Name:   Steven Huber [facility] Date of Exam: 07/06/2022 Medical Rec #:  [id number]      Height:       71.0 in Accession #:    [id number]     Weight:       188.7 lb Date of Birth:  [birth year]      BSA:          2.057 m Patient Age:    62 years       BP:           124/86 mmHg Patient Gender: M              HR:           61 bpm. Exam Location:  Inpatient Procedure: 2D Echo, Cardiac Doppler and Color Doppler Indications:    Chest pain  History:        Patient has no prior history of Echocardiogram examinations.                 Signs/Symptoms:Chest Pain; Risk Factors:Dyslipidemia. GERD.                 Elevated troponin.  Sonographer:    Clayton Lefort RDCS (AE) Referring Phys: Phineas Inches, E IMPRESSIONS  1. Left ventricular ejection fraction, by estimation, is 60 to 65%. The left ventricle has normal function. The left ventricle has no regional wall motion abnormalities. There is mild concentric left ventricular hypertrophy. Left ventricular diastolic parameters were normal.  2. Right  ventricular systolic function is normal. The right ventricular size is normal.  3. The mitral valve is normal in structure. No evidence of mitral valve regurgitation.  4. The aortic valve is tricuspid. Aortic valve regurgitation is not visualized. Aortic valve sclerosis/calcification is present, without any evidence of aortic stenosis.  5. Aortic dilatation noted. There is mild dilatation of the aortic root, measuring 38 mm. FINDINGS  Left Ventricle: Left ventricular ejection fraction, by estimation, is 60 to 65%. The left ventricle has normal function. The left ventricle has no regional wall motion abnormalities. The left ventricular internal cavity size was normal in size. There is  mild concentric left ventricular hypertrophy. Left ventricular diastolic parameters were normal. Right Ventricle: The right ventricular size is normal. No increase in right ventricular wall thickness. Right ventricular systolic function is normal. Left Atrium: Left atrial size was normal in size. Right Atrium: Right atrial size was normal in size. Prominent Chiari network. Pericardium: There is no evidence of pericardial effusion. Mitral Valve: The mitral valve is normal in structure. No evidence of mitral valve regurgitation. Tricuspid Valve: The tricuspid valve is normal in structure. Tricuspid valve regurgitation is not demonstrated. Aortic Valve: The aortic valve is tricuspid. Aortic valve regurgitation is not visualized. Aortic valve sclerosis/calcification is present, without any evidence of aortic stenosis. Aortic valve mean gradient measures 3.0 mmHg. Aortic valve peak gradient measures 5.3 mmHg. Aortic valve area, by VTI measures 3.82 cm. Pulmonic Valve: The pulmonic valve was normal in structure. Pulmonic valve regurgitation is not visualized. Aorta: Aortic dilatation noted. There is mild dilatation of the aortic root, measuring 38 mm. IAS/Shunts: The interatrial septum appears to be lipomatous. No atrial level shunt  detected by color flow Doppler.  LEFT VENTRICLE PLAX 2D LVIDd:         3.80 cm   Diastology LVIDs:  2.50 cm   LV e' medial:    5.87 cm/s LV PW:         1.20 cm   LV E/e' medial:  13.2 LV IVS:        1.20 cm   LV e' lateral:   9.57 cm/s LVOT diam:     2.20 cm   LV E/e' lateral: 8.1 LV SV:         89 LV SV Index:   43 LVOT Area:     3.80 cm  RIGHT VENTRICLE             IVC RV Basal diam:  2.90 cm     IVC diam: 1.00 cm RV S prime:     13.90 cm/s TAPSE (M-mode): 2.2 cm LEFT ATRIUM             Index        RIGHT ATRIUM           Index LA diam:        3.10 cm 1.51 cm/m   RA Area:     13.50 cm LA Vol (A2C):   49.7 ml 24.16 ml/m  RA Volume:   26.60 ml  12.93 ml/m LA Vol (A4C):   34.8 ml 16.92 ml/m LA Biplane Vol: 43.5 ml 21.15 ml/m  AORTIC VALVE AV Area (Vmax):    3.87 cm AV Area (Vmean):   3.64 cm AV Area (VTI):     3.82 cm AV Vmax:           115.00 cm/s AV Vmean:          78.500 cm/s AV VTI:            0.233 m AV Peak Grad:      5.3 mmHg AV Mean Grad:      3.0 mmHg LVOT Vmax:         117.00 cm/s LVOT Vmean:        75.100 cm/s LVOT VTI:          0.234 m LVOT/AV VTI ratio: 1.00  AORTA Ao Root diam: 3.80 cm Ao Asc diam:  3.20 cm MITRAL VALVE MV Area (PHT): 3.03 cm    SHUNTS MV Decel Time: 250 msec    Systemic VTI:  0.23 m MV E velocity: 77.60 cm/s  Systemic Diam: 2.20 cm MV A velocity: 65.40 cm/s MV E/A ratio:  1.19 Mihai Croitoru MD Electronically signed by Sanda Klein MD Signature Date/Time: 07/06/2022/1:21:55 PM    Final    DG Chest 2 View  Result Date: 07/05/2022 CLINICAL DATA:  Chest pain EXAM: CHEST - 2 VIEW COMPARISON:  None Available. FINDINGS: The heart size and mediastinal contours are within normal limits. Both lungs are clear. The visualized skeletal structures are unremarkable. IMPRESSION: No active cardiopulmonary disease. Electronically Signed   By: Davina Poke D.O.   On: 07/05/2022 16:52    Cardiac Studies   TTE 07/06/2022  1. Left ventricular ejection fraction, by  estimation, is 60 to 65%. The  left ventricle has normal function. The left ventricle has no regional  wall motion abnormalities. There is mild concentric left ventricular  hypertrophy. Left ventricular diastolic  parameters were normal.   2. Right ventricular systolic function is normal. The right ventricular  size is normal.   3. The mitral valve is normal in structure. No evidence of mitral valve  regurgitation.   4. The aortic valve is tricuspid. Aortic valve regurgitation is not  visualized. Aortic valve sclerosis/calcification is  present, without any  evidence of aortic stenosis.   5. Aortic dilatation noted. There is mild dilatation of the aortic root,  measuring 38 mm.   Patient Profile     62 y.o. male hx of HLD, presented with CP, c/f angina CCS II.  His risk included HLD, age and his story of exertional CP/jaw pain.He was started on heparin initially based on his story. Troponins were flat. No ischemic EKG. His echo was normal. We discussed that he does not have high risk ACS. Will plan for an outpatient coronary CT scan  Assessment & Plan    CP/HLD -continue home crestor  -LDL is 100, will FU titration of statin based on coronary CT - Lp(a) in process - can continue ASA and nitro SL until coronary CT scan  Otherwise, he can be discharged today. We discussed he should limit physical activity until after his coronary CT scan.  [facility] will sign off.   Medication Recommendations:  above Other recommendations (labs, testing, etc):  none Follow up as an outpatient:  pending coronary CT scan results   Time Spent Directly with Patient:  I have spent a total of 35 minutes with the patient reviewing hospital notes, telemetry, EKGs, labs and examining the patient as well as establishing an assessment and plan that was discussed personally with the patient.  > 50% of time was spent in direct patient care.   For questions or updates, please contact [facility] Please consult [email or web address] for contact info under        Signed, [name], MD  07/07/2022, 9:42 AM

## 2022-07-07 NOTE — Discharge Summary (Signed)
Physician Discharge Summary   Patient: Steven Huber MRN: [id number] DOB: 02/14/1961  Admit date:     07/05/2022  Discharge date: 07/07/22  Discharge Physician: Joseph Art   PCP: Pcp, No   Recommendations at discharge:  CTA as an outpatient-- cards to arrange follow up  Discharge Diagnoses: Principal Problem:   Chest pain Active Problems:   Elevated troponin   Transaminitis   Mixed hyperlipidemia   GERD (gastroesophageal reflux disease)    Hospital Course: CP/HLD -continue home crestor  -LDL is 100, will FU titration of statin based on coronary CT - Lp(a) in process - can continue ASA and nitro SL until coronary CT scan   Otherwise, he can be discharged today. We discussed he should limit physical activity until after his coronary CT scan.  Consultants: cards  Disposition: Home Diet recommendation:  Discharge Diet Orders (From admission, onward)     Start     Ordered   07/07/22 0000  Diet - low sodium heart healthy        07/07/22 1009            DISCHARGE MEDICATION: Allergies as of 07/07/2022   No Known Allergies      Medication List     STOP taking these medications    colchicine 0.6 MG tablet   meloxicam 15 MG tablet Commonly known as: MOBIC       TAKE these medications    aspirin EC 81 MG tablet Take 81 mg by mouth daily.   Crestor 10 MG tablet Generic drug: rosuvastatin Take 10 mg by mouth daily.   fluticasone 50 MCG/ACT nasal spray Commonly known as: FLONASE Place 1 spray into both nostrils daily.   nitroGLYCERIN 0.4 MG SL tablet Commonly known as: NITROSTAT Place 1 tablet (0.4 mg total) under the tongue every 5 (five) minutes as needed for chest pain.   omeprazole 20 MG capsule Commonly known as: PRILOSEC Take 20 mg by mouth daily.        Discharge Exam: Filed Weights   07/05/22 2039 07/06/22 0639  Weight: 83.9 kg 85.6 kg     The results of significant diagnostics from this hospitalization (including  imaging, microbiology, ancillary and laboratory) are listed below for reference.   Imaging Studies: ECHOCARDIOGRAM COMPLETE  Result Date: 07/06/2022    ECHOCARDIOGRAM REPORT   Patient Name:   Steven Huber [facility] Date of Exam: 07/06/2022 Medical Rec #:  [id number]      Height:       71.0 in Accession #:    [id number]     Weight:       188.7 lb Date of Birth:  [birth year]      BSA:          2.057 m Patient Age:    61 years       BP:           124/86 mmHg Patient Gender: M              HR:           61 bpm. Exam Location:  Inpatient Procedure: 2D Echo, Cardiac Doppler and Color Doppler Indications:    Chest pain  History:        Patient has no prior history of Echocardiogram examinations.                 Signs/Symptoms:Chest Pain; Risk Factors:Dyslipidemia. GERD.                 Elevated  troponin.  Sonographer:    Clayton Lefort RDCS (AE) Referring Phys: Phineas Inches, E IMPRESSIONS  1. Left ventricular ejection fraction, by estimation, is 60 to 65%. The left ventricle has normal function. The left ventricle has no regional wall motion abnormalities. There is mild concentric left ventricular hypertrophy. Left ventricular diastolic parameters were normal.  2. Right ventricular systolic function is normal. The right ventricular size is normal.  3. The mitral valve is normal in structure. No evidence of mitral valve regurgitation.  4. The aortic valve is tricuspid. Aortic valve regurgitation is not visualized. Aortic valve sclerosis/calcification is present, without any evidence of aortic stenosis.  5. Aortic dilatation noted. There is mild dilatation of the aortic root, measuring 38 mm. FINDINGS  Left Ventricle: Left ventricular ejection fraction, by estimation, is 60 to 65%. The left ventricle has normal function. The left ventricle has no regional wall motion abnormalities. The left ventricular internal cavity size was normal in size. There is  mild concentric left ventricular hypertrophy. Left ventricular diastolic  parameters were normal. Right Ventricle: The right ventricular size is normal. No increase in right ventricular wall thickness. Right ventricular systolic function is normal. Left Atrium: Left atrial size was normal in size. Right Atrium: Right atrial size was normal in size. Prominent Chiari network. Pericardium: There is no evidence of pericardial effusion. Mitral Valve: The mitral valve is normal in structure. No evidence of mitral valve regurgitation. Tricuspid Valve: The tricuspid valve is normal in structure. Tricuspid valve regurgitation is not demonstrated. Aortic Valve: The aortic valve is tricuspid. Aortic valve regurgitation is not visualized. Aortic valve sclerosis/calcification is present, without any evidence of aortic stenosis. Aortic valve mean gradient measures 3.0 mmHg. Aortic valve peak gradient measures 5.3 mmHg. Aortic valve area, by VTI measures 3.82 cm. Pulmonic Valve: The pulmonic valve was normal in structure. Pulmonic valve regurgitation is not visualized. Aorta: Aortic dilatation noted. There is mild dilatation of the aortic root, measuring 38 mm. IAS/Shunts: The interatrial septum appears to be lipomatous. No atrial level shunt detected by color flow Doppler.  LEFT VENTRICLE PLAX 2D LVIDd:         3.80 cm   Diastology LVIDs:         2.50 cm   LV e' medial:    5.87 cm/s LV PW:         1.20 cm   LV E/e' medial:  13.2 LV IVS:        1.20 cm   LV e' lateral:   9.57 cm/s LVOT diam:     2.20 cm   LV E/e' lateral: 8.1 LV SV:         89 LV SV Index:   43 LVOT Area:     3.80 cm  RIGHT VENTRICLE             IVC RV Basal diam:  2.90 cm     IVC diam: 1.00 cm RV S prime:     13.90 cm/s TAPSE (M-mode): 2.2 cm LEFT ATRIUM             Index        RIGHT ATRIUM           Index LA diam:        3.10 cm 1.51 cm/m   RA Area:     13.50 cm LA Vol (A2C):   49.7 ml 24.16 ml/m  RA Volume:   26.60 ml  12.93 ml/m LA Vol (A4C):   34.8 ml 16.92 ml/m LA  Biplane Vol: 43.5 ml 21.15 ml/m  AORTIC VALVE AV Area  (Vmax):    3.87 cm AV Area (Vmean):   3.64 cm AV Area (VTI):     3.82 cm AV Vmax:           115.00 cm/s AV Vmean:          78.500 cm/s AV VTI:            0.233 m AV Peak Grad:      5.3 mmHg AV Mean Grad:      3.0 mmHg LVOT Vmax:         117.00 cm/s LVOT Vmean:        75.100 cm/s LVOT VTI:          0.234 m LVOT/AV VTI ratio: 1.00  AORTA Ao Root diam: 3.80 cm Ao Asc diam:  3.20 cm MITRAL VALVE MV Area (PHT): 3.03 cm    SHUNTS MV Decel Time: 250 msec    Systemic VTI:  0.23 m MV E velocity: 77.60 cm/s  Systemic Diam: 2.20 cm MV A velocity: 65.40 cm/s MV E/A ratio:  1.19 Mihai Croitoru MD Electronically signed by Sanda Klein MD Signature Date/Time: 07/06/2022/1:21:55 PM    Final    DG Chest 2 View  Result Date: 07/05/2022 CLINICAL DATA:  Chest pain EXAM: CHEST - 2 VIEW COMPARISON:  None Available. FINDINGS: The heart size and mediastinal contours are within normal limits. Both lungs are clear. The visualized skeletal structures are unremarkable. IMPRESSION: No active cardiopulmonary disease. Electronically Signed   By: Davina Poke D.O.   On: 07/05/2022 16:52    Microbiology: Results for orders placed or performed during the hospital encounter of 05/25/14  Surgical pcr screen     Status: Abnormal   Collection Time: 05/26/14  5:02 AM   Specimen: Nasal Mucosa; Nasal Swab  Result Value Ref Range Status   MRSA, PCR NEGATIVE NEGATIVE Final   Staphylococcus aureus POSITIVE (A) NEGATIVE Final    Comment:        The Xpert SA Assay (FDA approved for NASAL specimens in patients over 25 years of age), is one component of a comprehensive surveillance program.  Test performance has been validated by [employer] for patients greater than or equal to 30 year old. It is not intended to diagnose infection nor to guide or monitor treatment.     Labs: CBC: Recent Labs  Lab 07/05/22 1618 07/06/22 0701 07/07/22 0145  WBC 6.7 6.5 6.5  HGB 15.3 14.6 14.2  HCT 44.2 40.4 39.5  MCV 88.8 87.6  88.6  PLT 213 190 329   Basic Metabolic Panel: Recent Labs  Lab 07/05/22 1618 07/06/22 0701 07/07/22 0145  NA 140 137 136  K 3.7 3.7 3.9  CL 105 105 101  CO2 26 25 27   GLUCOSE 99 114* 119*  BUN 13 13 11   CREATININE 1.10 1.03 1.14  CALCIUM 9.6 9.3 9.3  MG  --   --  2.1   Liver Function Tests: Recent Labs  Lab 07/05/22 1618 07/06/22 0701 07/07/22 0145  AST 60* 67* 66*  ALT 104* 105* 111*  ALKPHOS 51 49 52  BILITOT 0.6 0.6 0.7  PROT 7.4 6.7 6.5  ALBUMIN 4.2 3.7 3.5   CBG: No results for input(s): "GLUCAP" in the last 168 hours.  Discharge time spent: greater than 30 minutes.  Signed: Geradine Girt, DO [facility] 07/07/2022

## 2022-07-07 NOTE — Progress Notes (Signed)
ANTICOAGULATION CONSULT NOTE  Pharmacy Consult for heparin  Indication: chest pain/ACS  No Known Allergies  Patient Measurements: Height: 5\' 11"  (180.3 cm) Weight: 85.6 kg (188 lb 11.2 oz) IBW/kg (Calculated) : 75.3 Heparin Dosing Weight: 83.9  Vital Signs: Temp: 98 F (36.7 C) (01/01 0820) Temp Source: Oral (01/01 0820) BP: 132/92 (01/01 0820) Pulse Rate: 56 (01/01 0820)  Labs: Recent Labs    07/05/22 1618 07/05/22 1818 07/06/22 0005 07/06/22 0319 07/06/22 0701 07/06/22 1107 07/06/22 1751 07/07/22 0145  HGB 15.3  --   --   --  14.6  --   --  14.2  HCT 44.2  --   --   --  40.4  --   --  39.5  PLT 213  --   --   --  190  --   --  175  HEPARINUNFRC  --   --   --    < > 0.26* 0.33 0.30 0.34  CREATININE 1.10  --   --   --  1.03  --   --  1.14  TROPONINIHS 6   < > 44*  --  41* 37*  --   --    < > = values in this interval not displayed.     Estimated Creatinine Clearance: 74.3 mL/min (by C-G formula based on SCr of 1.14 mg/dL).  Assessment: 62 y.o. male with chest pain for heparin.   Heparin level therapeutic at 0.34 on 1350 units/hr. CBC stable. No issues with infusion or bleeding noted.   Goal of Therapy:  Heparin level 0.3-0.7 units/ml Monitor platelets by anticoagulation protocol: Yes   Plan:  -Continue heparin 1350 units/hr  -Heparin level and CBC daily -Monitor for s/sx of bleeding   Francena Hanly, PharmD Pharmacy Resident  07/07/2022 9:26 AM

## 2022-07-07 NOTE — H&P (View-Only) (Signed)
 Rounding Note    Patient Name: Steven Huber Date of Encounter: 07/07/2022   [facility] Cardiologist: None , Toluwani Ruder MD, MS  Subjective   He feels well this AM. No recurrence of chest pain  Inpatient Medications    Scheduled Meds:  aspirin EC  81 mg Oral Daily   melatonin  3 mg Oral QHS   pantoprazole  40 mg Oral Daily   Continuous Infusions:  heparin 1,350 Units/hr (07/06/22 2101)   PRN Meds: acetaminophen **OR** acetaminophen, nitroGLYCERIN, ondansetron **OR** ondansetron (ZOFRAN) IV   Vital Signs    Vitals:   07/06/22 2040 07/07/22 0019 07/07/22 0431 07/07/22 0820  BP: 124/83 121/83 (!) 136/96 (!) 132/92  Pulse: 70 64 62 (!) 56  Resp: 17 18 20 14  Temp: 98.3 F (36.8 C) 98 F (36.7 C) 98.1 F (36.7 C) 98 F (36.7 C)  TempSrc: Oral Oral Oral Oral  SpO2: 96% 96% 95% 95%  Weight:      Height:       No intake or output data in the 24 hours ending 07/07/22 0942    07/06/2022    6:39 AM 07/05/2022    8:39 PM 05/25/2018    9:23 AM  Last 3 Weights  Weight (lbs) 188 lb 11.2 oz 185 lb 185 lb  Weight (kg) 85.594 kg 83.915 kg 83.915 kg      Telemetry    NSR - Personally Reviewed  ECG    No new - Personally Reviewed  Physical Exam   Vitals:   07/07/22 0431 07/07/22 0820  BP: (!) 136/96 (!) 132/92  Pulse: 62 (!) 56  Resp: 20 14  Temp: 98.1 F (36.7 C) 98 F (36.7 C)  SpO2: 95% 95%    GEN: No acute distress.   Neck: No JVD Cardiac: RRR, no murmurs, rubs, or gallops.  Respiratory: Clear to auscultation bilaterally. GI: Soft, nontender, non-distended  MS: No edema; No deformity. Neuro:  Nonfocal  Psych: Normal affect   Labs    High Sensitivity Troponin:   Recent Labs  Lab 07/05/22 1618 07/05/22 1818 07/06/22 0005 07/06/22 0701 07/06/22 1107  TROPONINIHS 6 21* 44* 41* 37*     Chemistry Recent Labs  Lab 07/05/22 1618 07/06/22 0701 07/07/22 0145  NA 140 137 136  K 3.7 3.7 3.9  CL 105 105 101  CO2 26 25 27   GLUCOSE 99 114* 119*  BUN 13 13 11  CREATININE 1.10 1.03 1.14  CALCIUM 9.6 9.3 9.3  MG  --   --  2.1  PROT 7.4 6.7 6.5  ALBUMIN 4.2 3.7 3.5  AST 60* 67* 66*  ALT 104* 105* 111*  ALKPHOS 51 49 52  BILITOT 0.6 0.6 0.7  GFRNONAA >60 >60 >60  ANIONGAP 9 7 8    Lipids  Recent Labs  Lab 07/06/22 1107  CHOL 184  TRIG 133  HDL 57  LDLCALC 100*  CHOLHDL 3.2    Hematology Recent Labs  Lab 07/05/22 1618 07/06/22 0701 07/07/22 0145  WBC 6.7 6.5 6.5  RBC 4.98 4.61 4.46  HGB 15.3 14.6 14.2  HCT 44.2 40.4 39.5  MCV 88.8 87.6 88.6  MCH 30.7 31.7 31.8  MCHC 34.6 36.1* 35.9  RDW 12.3 12.6 12.3  PLT 213 190 175   Thyroid No results for input(s): "TSH", "FREET4" in the last 168 hours.  BNPNo results for input(s): "BNP", "PROBNP" in the last 168 hours.  DDimer No results for input(s): "DDIMER" in the last 168   hours.   Radiology    ECHOCARDIOGRAM COMPLETE  Result Date: 07/06/2022    ECHOCARDIOGRAM REPORT   Patient Name:   Steven Huber [facility] Date of Exam: 07/06/2022 Medical Rec #:  [id number]      Height:       71.0 in Accession #:    [id number]     Weight:       188.7 lb Date of Birth:  [birth year]      BSA:          2.057 m Patient Age:    62 years       BP:           124/86 mmHg Patient Gender: M              HR:           61 bpm. Exam Location:  Inpatient Procedure: 2D Echo, Cardiac Doppler and Color Doppler Indications:    Chest pain  History:        Patient has no prior history of Echocardiogram examinations.                 Signs/Symptoms:Chest Pain; Risk Factors:Dyslipidemia. GERD.                 Elevated troponin.  Sonographer:    Clayton Lefort RDCS (AE) Referring Phys: Phineas Inches, E IMPRESSIONS  1. Left ventricular ejection fraction, by estimation, is 60 to 65%. The left ventricle has normal function. The left ventricle has no regional wall motion abnormalities. There is mild concentric left ventricular hypertrophy. Left ventricular diastolic parameters were normal.  2. Right  ventricular systolic function is normal. The right ventricular size is normal.  3. The mitral valve is normal in structure. No evidence of mitral valve regurgitation.  4. The aortic valve is tricuspid. Aortic valve regurgitation is not visualized. Aortic valve sclerosis/calcification is present, without any evidence of aortic stenosis.  5. Aortic dilatation noted. There is mild dilatation of the aortic root, measuring 38 mm. FINDINGS  Left Ventricle: Left ventricular ejection fraction, by estimation, is 60 to 65%. The left ventricle has normal function. The left ventricle has no regional wall motion abnormalities. The left ventricular internal cavity size was normal in size. There is  mild concentric left ventricular hypertrophy. Left ventricular diastolic parameters were normal. Right Ventricle: The right ventricular size is normal. No increase in right ventricular wall thickness. Right ventricular systolic function is normal. Left Atrium: Left atrial size was normal in size. Right Atrium: Right atrial size was normal in size. Prominent Chiari network. Pericardium: There is no evidence of pericardial effusion. Mitral Valve: The mitral valve is normal in structure. No evidence of mitral valve regurgitation. Tricuspid Valve: The tricuspid valve is normal in structure. Tricuspid valve regurgitation is not demonstrated. Aortic Valve: The aortic valve is tricuspid. Aortic valve regurgitation is not visualized. Aortic valve sclerosis/calcification is present, without any evidence of aortic stenosis. Aortic valve mean gradient measures 3.0 mmHg. Aortic valve peak gradient measures 5.3 mmHg. Aortic valve area, by VTI measures 3.82 cm. Pulmonic Valve: The pulmonic valve was normal in structure. Pulmonic valve regurgitation is not visualized. Aorta: Aortic dilatation noted. There is mild dilatation of the aortic root, measuring 38 mm. IAS/Shunts: The interatrial septum appears to be lipomatous. No atrial level shunt  detected by color flow Doppler.  LEFT VENTRICLE PLAX 2D LVIDd:         3.80 cm   Diastology LVIDs:  2.50 cm   LV e' medial:    5.87 cm/s LV PW:         1.20 cm   LV E/e' medial:  13.2 LV IVS:        1.20 cm   LV e' lateral:   9.57 cm/s LVOT diam:     2.20 cm   LV E/e' lateral: 8.1 LV SV:         89 LV SV Index:   43 LVOT Area:     3.80 cm  RIGHT VENTRICLE             IVC RV Basal diam:  2.90 cm     IVC diam: 1.00 cm RV S prime:     13.90 cm/s TAPSE (M-mode): 2.2 cm LEFT ATRIUM             Index        RIGHT ATRIUM           Index LA diam:        3.10 cm 1.51 cm/m   RA Area:     13.50 cm LA Vol (A2C):   49.7 ml 24.16 ml/m  RA Volume:   26.60 ml  12.93 ml/m LA Vol (A4C):   34.8 ml 16.92 ml/m LA Biplane Vol: 43.5 ml 21.15 ml/m  AORTIC VALVE AV Area (Vmax):    3.87 cm AV Area (Vmean):   3.64 cm AV Area (VTI):     3.82 cm AV Vmax:           115.00 cm/s AV Vmean:          78.500 cm/s AV VTI:            0.233 m AV Peak Grad:      5.3 mmHg AV Mean Grad:      3.0 mmHg LVOT Vmax:         117.00 cm/s LVOT Vmean:        75.100 cm/s LVOT VTI:          0.234 m LVOT/AV VTI ratio: 1.00  AORTA Ao Root diam: 3.80 cm Ao Asc diam:  3.20 cm MITRAL VALVE MV Area (PHT): 3.03 cm    SHUNTS MV Decel Time: 250 msec    Systemic VTI:  0.23 m MV E velocity: 77.60 cm/s  Systemic Diam: 2.20 cm MV A velocity: 65.40 cm/s MV E/A ratio:  1.19 Mihai Croitoru MD Electronically signed by Sanda Klein MD Signature Date/Time: 07/06/2022/1:21:55 PM    Final    DG Chest 2 View  Result Date: 07/05/2022 CLINICAL DATA:  Chest pain EXAM: CHEST - 2 VIEW COMPARISON:  None Available. FINDINGS: The heart size and mediastinal contours are within normal limits. Both lungs are clear. The visualized skeletal structures are unremarkable. IMPRESSION: No active cardiopulmonary disease. Electronically Signed   By: Davina Poke D.O.   On: 07/05/2022 16:52    Cardiac Studies   TTE 07/06/2022  1. Left ventricular ejection fraction, by  estimation, is 60 to 65%. The  left ventricle has normal function. The left ventricle has no regional  wall motion abnormalities. There is mild concentric left ventricular  hypertrophy. Left ventricular diastolic  parameters were normal.   2. Right ventricular systolic function is normal. The right ventricular  size is normal.   3. The mitral valve is normal in structure. No evidence of mitral valve  regurgitation.   4. The aortic valve is tricuspid. Aortic valve regurgitation is not  visualized. Aortic valve sclerosis/calcification is  present, without any  evidence of aortic stenosis.   5. Aortic dilatation noted. There is mild dilatation of the aortic root,  measuring 38 mm.   Patient Profile     61 y.o. male hx of HLD, presented with CP, c/f angina CCS II.  His risk included HLD, age and his story of exertional CP/jaw pain.He was started on heparin initially based on his story. Troponins were flat. No ischemic EKG. His echo was normal. We discussed that he does not have high risk ACS. Will plan for an outpatient coronary CT scan  Assessment & Plan    CP/HLD -continue home crestor  -LDL is 100, will FU titration of statin based on coronary CT - Lp(a) in process - can continue ASA and nitro SL until coronary CT scan  Otherwise, he can be discharged today. We discussed he should limit physical activity until after his coronary CT scan.  [facility] will sign off.   Medication Recommendations:  above Other recommendations (labs, testing, etc):  none Follow up as an outpatient:  pending coronary CT scan results   Time Spent Directly with Patient:  I have spent a total of 35 minutes with the patient reviewing hospital notes, telemetry, EKGs, labs and examining the patient as well as establishing an assessment and plan that was discussed personally with the patient.  > 50% of time was spent in direct patient care.   For questions or updates, please contact [facility] Please consult [email or web address] for contact info under        Signed, Maddax Palinkas E, MD  07/07/2022, 9:42 AM    

## 2022-07-08 ENCOUNTER — Other Ambulatory Visit (HOSPITAL_COMMUNITY): Payer: Self-pay | Admitting: Internal Medicine

## 2022-07-08 ENCOUNTER — Other Ambulatory Visit (HOSPITAL_COMMUNITY): Payer: Self-pay | Admitting: Emergency Medicine

## 2022-07-08 ENCOUNTER — Telehealth (HOSPITAL_COMMUNITY): Payer: Self-pay | Admitting: Emergency Medicine

## 2022-07-08 DIAGNOSIS — R079 Chest pain, unspecified: Secondary | ICD-10-CM

## 2022-07-08 LAB — HEMOGLOBIN A1C
Hgb A1c MFr Bld: 5.9 % — ABNORMAL HIGH (ref 4.8–5.6)
Mean Plasma Glucose: 123 mg/dL

## 2022-07-08 MED ORDER — METOPROLOL TARTRATE 50 MG PO TABS: 50.0000 mg | ORAL_TABLET | Freq: Once | ORAL | 0 refills | Status: DC

## 2022-07-08 NOTE — Telephone Encounter (Signed)
CCTA schedule for 07/16/22 at 9:30a 50mg  metoprolol tartrate rx sent to [facility]  Marchia Bond RN Navigator Cardiac Imaging [facility] [phone] Office  [phone] Cell

## 2022-07-09 LAB — LIPOPROTEIN A (LPA): Lipoprotein (a): 104.2 nmol/L — ABNORMAL HIGH (ref <75.0)

## 2022-07-14 ENCOUNTER — Encounter (HOSPITAL_COMMUNITY): Payer: Self-pay

## 2022-07-15 ENCOUNTER — Telehealth (HOSPITAL_COMMUNITY): Payer: Self-pay | Admitting: *Deleted

## 2022-07-15 NOTE — Telephone Encounter (Signed)
Patient returning call about his upcoming cardiac imaging study; pt verbalizes understanding of appt date/time, parking situation and where to check in, pre-test NPO status and medications ordered, and verified current allergies; name and call back number provided for further questions should they arise  Gordy Clement RN Navigator Cardiac [facility] [phone] office [phone] cell  Patient reports resting HR is about 60bpm. Advised patient to only take 25mg  metoprolol tartrate if HR is 60bpm or greater and 50mg  if HR is 65bpm or greater. He is aware to arrive at 9am.

## 2022-07-15 NOTE — Telephone Encounter (Signed)
Attempted to call patient regarding upcoming cardiac CT appointment. °Left message on voicemail with name and callback number ° °Sabriah Hobbins RN Navigator Cardiac Imaging °[facility] °[phone] Office °[phone] Cell ° °

## 2022-07-16 ENCOUNTER — Other Ambulatory Visit: Payer: Self-pay | Admitting: Cardiovascular Disease

## 2022-07-16 ENCOUNTER — Ambulatory Visit (HOSPITAL_COMMUNITY)
Admission: RE | Admit: 2022-07-16 | Discharge: 2022-07-16 | Disposition: A | Discharge status: Home / Self Care | Payer: BC Managed Care – PPO | Source: Ambulatory Visit | Attending: Internal Medicine | Admitting: Internal Medicine

## 2022-07-16 DIAGNOSIS — R931 Abnormal findings on diagnostic imaging of heart and coronary circulation: Secondary | ICD-10-CM | POA: Diagnosis not present

## 2022-07-16 DIAGNOSIS — R079 Chest pain, unspecified: Secondary | ICD-10-CM | POA: Insufficient documentation

## 2022-07-16 MED ORDER — NITROGLYCERIN 0.4 MG SL SUBL
0.8000 mg | SUBLINGUAL_TABLET | Freq: Once | SUBLINGUAL | Status: AC
  Administered 2022-07-16: 0.8 mg via SUBLINGUAL

## 2022-07-16 MED ORDER — IOHEXOL 350 MG/ML SOLN
95.0000 mL | Freq: Once | INTRAVENOUS | Status: AC | PRN
  Administered 2022-07-16: 95 mL via INTRAVENOUS

## 2022-07-16 MED ORDER — NITROGLYCERIN 0.4 MG SL SUBL
SUBLINGUAL_TABLET | SUBLINGUAL | Status: AC
  Filled 2022-07-16: qty 2

## 2022-07-16 NOTE — Progress Notes (Signed)
CT FFR ordered.   [place] T. Audie Box, MD, [place]  [address], [place], [place] [address] [phone]  12:03 PM

## 2022-07-17 ENCOUNTER — Ambulatory Visit (HOSPITAL_BASED_OUTPATIENT_CLINIC_OR_DEPARTMENT_OTHER)
Admission: RE | Admit: 2022-07-17 | Discharge: 2022-07-17 | Disposition: A | Discharge status: Home / Self Care | Payer: BC Managed Care – PPO | Source: Ambulatory Visit | Attending: Cardiovascular Disease | Admitting: Cardiovascular Disease

## 2022-07-17 DIAGNOSIS — R931 Abnormal findings on diagnostic imaging of heart and coronary circulation: Secondary | ICD-10-CM | POA: Diagnosis not present

## 2022-07-17 DIAGNOSIS — R079 Chest pain, unspecified: Secondary | ICD-10-CM | POA: Diagnosis not present

## 2022-07-17 NOTE — Telephone Encounter (Signed)
Called Mr. Steven Huber. Discussed his CT scan. Concern for RCA disease. He is amenable to [facility].  Shared Decision Making/Informed Consent The risks [stroke (1 in 1000), death (1 in 1000), kidney failure [usually temporary] (1 in 500), bleeding (1 in 200), allergic reaction [possibly serious] (1 in 200)], benefits (diagnostic support and management of coronary artery disease) and alternatives of a cardiac catheterization were discussed in detail with Mr. Steven Huber and he is willing to proceed.

## 2022-07-18 ENCOUNTER — Telehealth: Payer: Self-pay | Admitting: Internal Medicine

## 2022-07-18 NOTE — Telephone Encounter (Signed)
Janina Mayo, MD 07/17/2022  4:24 PM EST     Hi [name], please arrange for a [facility] for Mr. Steven Huber. We discussed risks/benefits. Will put in a telephone note.   Spoke with patient- he is scheduled for [facility] Tuesday 1/16 with Dr.  Ellyn Hack at 5:30- instructions sent to patient via [place]- also made patient aware of instructions and he verbalized understanding. Recent labs completed on 07/07/22. Advised patient to call back to office with any issues, questions, or concerns. Patient verbalized understanding.

## 2022-07-18 NOTE — Telephone Encounter (Signed)
Patient is returning RN's call for CT results. Please advise.

## 2022-07-21 ENCOUNTER — Telehealth: Payer: Self-pay | Admitting: *Deleted

## 2022-07-21 NOTE — Telephone Encounter (Addendum)
Cardiac Catheterization scheduled at [facility] for: Tuesday July 22, 2022 7:30 AM Arrival time and place: [place] Entrance A at: 5:30 AM  Nothing to eat after midnight prior to procedure, clear liquids until 5 AM day of procedure.   Medication instructions: -Usual morning medications can be taken with sips of water including aspirin 81 mg.  Confirmed patient has responsible adult to drive home post procedure and be with patient first 24 hours after arriving home.  Patient reports no new symptoms concerning for COVID-19 in the past 10 days.  Reviewed procedure instructions with patient.

## 2022-07-22 ENCOUNTER — Encounter (HOSPITAL_COMMUNITY)
Admission: RE | Disposition: A | Discharge status: Home / Self Care | Payer: Self-pay | Source: Home / Self Care | Attending: Cardiology

## 2022-07-22 ENCOUNTER — Ambulatory Visit (HOSPITAL_COMMUNITY)
Admission: RE | Admit: 2022-07-22 | Discharge: 2022-07-22 | Disposition: A | Discharge status: Home / Self Care | Payer: BC Managed Care – PPO | Attending: Cardiology | Admitting: Cardiology

## 2022-07-22 ENCOUNTER — Other Ambulatory Visit (HOSPITAL_COMMUNITY): Payer: Self-pay

## 2022-07-22 ENCOUNTER — Other Ambulatory Visit: Payer: Self-pay

## 2022-07-22 ENCOUNTER — Encounter (HOSPITAL_COMMUNITY): Payer: Self-pay | Admitting: Cardiology

## 2022-07-22 DIAGNOSIS — E782 Mixed hyperlipidemia: Secondary | ICD-10-CM | POA: Diagnosis not present

## 2022-07-22 DIAGNOSIS — R079 Chest pain, unspecified: Secondary | ICD-10-CM | POA: Diagnosis not present

## 2022-07-22 DIAGNOSIS — K219 Gastro-esophageal reflux disease without esophagitis: Secondary | ICD-10-CM | POA: Diagnosis not present

## 2022-07-22 DIAGNOSIS — I200 Unstable angina: Secondary | ICD-10-CM

## 2022-07-22 DIAGNOSIS — I2510 Atherosclerotic heart disease of native coronary artery without angina pectoris: Secondary | ICD-10-CM | POA: Insufficient documentation

## 2022-07-22 DIAGNOSIS — I25110 Atherosclerotic heart disease of native coronary artery with unstable angina pectoris: Secondary | ICD-10-CM

## 2022-07-22 DIAGNOSIS — Z955 Presence of coronary angioplasty implant and graft: Secondary | ICD-10-CM

## 2022-07-22 DIAGNOSIS — R931 Abnormal findings on diagnostic imaging of heart and coronary circulation: Secondary | ICD-10-CM

## 2022-07-22 HISTORY — PX: CORONARY STENT INTERVENTION: CATH118234

## 2022-07-22 HISTORY — PX: LEFT HEART CATH AND CORONARY ANGIOGRAPHY: CATH118249

## 2022-07-22 LAB — POCT ACTIVATED CLOTTING TIME
Activated Clotting Time: 250 seconds
Activated Clotting Time: 298 seconds

## 2022-07-22 SURGERY — LEFT HEART CATH AND CORONARY ANGIOGRAPHY
Anesthesia: LOCAL

## 2022-07-22 MED ORDER — IOHEXOL 350 MG/ML SOLN
INTRAVENOUS | Status: DC | PRN
  Administered 2022-07-22: 150 mL

## 2022-07-22 MED ORDER — HYDRALAZINE HCL 20 MG/ML IJ SOLN: 10.0000 mg | INTRAMUSCULAR | Status: DC | PRN

## 2022-07-22 MED ORDER — CLOPIDOGREL BISULFATE 300 MG PO TABS
ORAL_TABLET | ORAL | Status: DC | PRN
  Administered 2022-07-22 (×2): 300 mg via ORAL

## 2022-07-22 MED ORDER — SODIUM CHLORIDE 0.9% FLUSH: 3.0000 mL | INTRAVENOUS | Status: DC | PRN

## 2022-07-22 MED ORDER — MIDAZOLAM HCL 2 MG/2ML IJ SOLN
INTRAMUSCULAR | Status: AC
  Filled 2022-07-22: qty 2

## 2022-07-22 MED ORDER — HEPARIN SODIUM (PORCINE) 1000 UNIT/ML IJ SOLN
INTRAMUSCULAR | Status: AC
  Filled 2022-07-22: qty 10

## 2022-07-22 MED ORDER — CLOPIDOGREL BISULFATE 75 MG PO TABS
75.0000 mg | ORAL_TABLET | Freq: Every day | ORAL | 11 refills | Status: DC
  Filled 2022-07-22 (×2): qty 30, 30d supply, fill #0

## 2022-07-22 MED ORDER — HEPARIN (PORCINE) IN NACL 1000-0.9 UT/500ML-% IV SOLN
INTRAVENOUS | Status: AC
  Filled 2022-07-22: qty 500

## 2022-07-22 MED ORDER — SODIUM CHLORIDE 0.9 % IV SOLN: 250.0000 mL | INTRAVENOUS | Status: DC | PRN

## 2022-07-22 MED ORDER — CLOPIDOGREL BISULFATE 75 MG PO TABS: 75.0000 mg | ORAL_TABLET | Freq: Every day | ORAL | 0 refills | Status: DC

## 2022-07-22 MED ORDER — HEPARIN SODIUM (PORCINE) 1000 UNIT/ML IJ SOLN
INTRAMUSCULAR | Status: DC | PRN
  Administered 2022-07-22: 4000 [IU] via INTRAVENOUS
  Administered 2022-07-22: 5000 [IU] via INTRAVENOUS
  Administered 2022-07-22: 3000 [IU] via INTRAVENOUS

## 2022-07-22 MED ORDER — FENTANYL CITRATE (PF) 100 MCG/2ML IJ SOLN
INTRAMUSCULAR | Status: DC | PRN
  Administered 2022-07-22 (×3): 25 ug via INTRAVENOUS

## 2022-07-22 MED ORDER — NITROGLYCERIN 0.4 MG SL SUBL
0.4000 mg | SUBLINGUAL_TABLET | SUBLINGUAL | 3 refills | Status: AC | PRN
  Filled 2022-07-22: qty 25, 30d supply, fill #0

## 2022-07-22 MED ORDER — SODIUM CHLORIDE 0.9 % WEIGHT BASED INFUSION: 84.0000 mL/h | INTRAVENOUS | Status: DC

## 2022-07-22 MED ORDER — ROSUVASTATIN CALCIUM 20 MG PO TABS
20.0000 mg | ORAL_TABLET | Freq: Every morning | ORAL | 3 refills | Status: DC
  Filled 2022-07-22: qty 40, 40d supply, fill #0

## 2022-07-22 MED ORDER — NITROGLYCERIN 1 MG/10 ML FOR IR/CATH LAB
INTRA_ARTERIAL | Status: AC
  Filled 2022-07-22: qty 10

## 2022-07-22 MED ORDER — LIDOCAINE HCL (PF) 1 % IJ SOLN
INTRAMUSCULAR | Status: DC | PRN
  Administered 2022-07-22: 2 mL via INTRADERMAL

## 2022-07-22 MED ORDER — ASPIRIN 81 MG PO TBEC: 81.0000 mg | DELAYED_RELEASE_TABLET | Freq: Every day | ORAL | 0 refills | Status: DC

## 2022-07-22 MED ORDER — LIDOCAINE HCL (PF) 1 % IJ SOLN
INTRAMUSCULAR | Status: AC
  Filled 2022-07-22: qty 30

## 2022-07-22 MED ORDER — SODIUM CHLORIDE 0.9 % WEIGHT BASED INFUSION
252.0000 mL/h | INTRAVENOUS | Status: AC
  Administered 2022-07-22: 3 mL/kg/h via INTRAVENOUS

## 2022-07-22 MED ORDER — HEPARIN (PORCINE) IN NACL 1000-0.9 UT/500ML-% IV SOLN
INTRAVENOUS | Status: AC
  Filled 2022-07-22: qty 1000

## 2022-07-22 MED ORDER — CLOPIDOGREL BISULFATE 75 MG PO TABS
75.0000 mg | ORAL_TABLET | Freq: Every day | ORAL | 3 refills | Status: DC
  Filled 2022-07-22: qty 90, 90d supply, fill #0

## 2022-07-22 MED ORDER — MIDAZOLAM HCL 2 MG/2ML IJ SOLN
INTRAMUSCULAR | Status: DC | PRN
  Administered 2022-07-22: 1 mg via INTRAVENOUS
  Administered 2022-07-22: 2 mg via INTRAVENOUS
  Administered 2022-07-22: 1 mg via INTRAVENOUS

## 2022-07-22 MED ORDER — ASPIRIN 81 MG PO CHEW: 81.0000 mg | CHEWABLE_TABLET | ORAL | Status: AC

## 2022-07-22 MED ORDER — VERAPAMIL HCL 2.5 MG/ML IV SOLN
INTRAVENOUS | Status: DC | PRN
  Administered 2022-07-22: 10 mL via INTRA_ARTERIAL

## 2022-07-22 MED ORDER — ASPIRIN 81 MG PO TBEC
81.0000 mg | DELAYED_RELEASE_TABLET | Freq: Every day | ORAL | 5 refills | Status: AC
  Filled 2022-07-22: qty 30, 30d supply, fill #0

## 2022-07-22 MED ORDER — FENTANYL CITRATE (PF) 100 MCG/2ML IJ SOLN
INTRAMUSCULAR | Status: AC
  Filled 2022-07-22: qty 2

## 2022-07-22 MED ORDER — SODIUM CHLORIDE 0.9% FLUSH: 3.0000 mL | Freq: Two times a day (BID) | INTRAVENOUS | Status: DC

## 2022-07-22 MED ORDER — LABETALOL HCL 5 MG/ML IV SOLN: 10.0000 mg | INTRAVENOUS | Status: DC | PRN

## 2022-07-22 MED ORDER — ACETAMINOPHEN 325 MG PO TABS: 650.0000 mg | ORAL_TABLET | ORAL | Status: DC | PRN

## 2022-07-22 MED ORDER — SODIUM CHLORIDE 0.9 % WEIGHT BASED INFUSION: 1.0000 mL/kg/h | INTRAVENOUS | Status: DC

## 2022-07-22 MED ORDER — CLOPIDOGREL BISULFATE 300 MG PO TABS
ORAL_TABLET | ORAL | Status: AC
  Filled 2022-07-22: qty 2

## 2022-07-22 MED ORDER — PANTOPRAZOLE SODIUM 40 MG PO TBEC
40.0000 mg | DELAYED_RELEASE_TABLET | Freq: Every day | ORAL | 1 refills | Status: DC
  Filled 2022-07-22: qty 30, 30d supply, fill #0

## 2022-07-22 MED ORDER — HEPARIN (PORCINE) IN NACL 1000-0.9 UT/500ML-% IV SOLN
INTRAVENOUS | Status: DC | PRN
  Administered 2022-07-22 (×4): 500 mL

## 2022-07-22 MED ORDER — VERAPAMIL HCL 2.5 MG/ML IV SOLN
INTRAVENOUS | Status: AC
  Filled 2022-07-22: qty 2

## 2022-07-22 MED ORDER — ONDANSETRON HCL 4 MG/2ML IJ SOLN: 4.0000 mg | Freq: Four times a day (QID) | INTRAMUSCULAR | Status: DC | PRN

## 2022-07-22 SURGICAL SUPPLY — 21 items
BALLN EMERGE MR 2.5X12 (BALLOONS) ×1
BALLOON EMERGE MR 2.5X12 (BALLOONS) IMPLANT
CATH 5FR JL3.5 JR4 ANG PIG MP (CATHETERS) IMPLANT
CATH LAUNCHER 6FR JR4 (CATHETERS) IMPLANT
DEVICE RAD COMP TR BAND LRG (VASCULAR PRODUCTS) IMPLANT
GLIDESHEATH SLEND SS 6F .021 (SHEATH) IMPLANT
GUIDEWIRE INQWIRE 1.5J.035X260 (WIRE) IMPLANT
INQWIRE 1.5J .035X260CM (WIRE) ×1
KIT ENCORE 26 ADVANTAGE (KITS) IMPLANT
KIT HEART LEFT (KITS) ×1 IMPLANT
PACK CARDIAC CATHETERIZATION (CUSTOM PROCEDURE TRAY) ×1 IMPLANT
SHEATH PROBE COVER 6X72 (BAG) IMPLANT
STENT SYNERGY XD 3.0X12 (Permanent Stent) IMPLANT
STENT SYNERGY XD 3.0X16 (Permanent Stent) IMPLANT
SYNERGY XD 3.0X12 (Permanent Stent) ×1 IMPLANT
SYNERGY XD 3.0X16 (Permanent Stent) ×1 IMPLANT
TRANSDUCER W/STOPCOCK (MISCELLANEOUS) ×1 IMPLANT
TUBING CIL FLEX 10 FLL-RA (TUBING) ×1 IMPLANT
WIRE ASAHI PROWATER 180CM (WIRE) IMPLANT
WIRE HI TORQ BMW 190CM (WIRE) IMPLANT
WIRE RUNTHROUGH .014X180CM (WIRE) IMPLANT

## 2022-07-22 NOTE — Discharge Summary (Signed)
Discharge Summary for Same Day PCI   Patient ID: Steven Huber MRN: [id number]; DOB: 11/07/1960  Admit date: 07/22/2022 Discharge date: 07/22/2022  Primary Care Provider: Aliene BeamsHagler, Rachel, MD  Primary Cardiologist: Maisie FusBranch, Mary E, MD  Primary Electrophysiologist:  None   Discharge Diagnoses    Principal Problem:   Abnormal cardiac CT angiography Active Problems:   Mixed hyperlipidemia   GERD (gastroesophageal reflux disease)   Unstable angina (HCC)   CAD (coronary artery disease)    Diagnostic Studies/Procedures    Cardiac Catheterization 07/22/2022:    Prox RCA-1 lesion is 25% stenosed.  Prox RCA-2 lesion is 45% stenosed.   CULPRIT lesion: Dist RCA lesion is 90% stenosed.   A drug-eluting stent was successfully placed using a SYNERGY XD 3.0X12. -Deployed to 3.3 mm.  Post intervention, there is a 0% residual stenosis.  TIMI-3 flow pre and post   Mid RCA lesion is 80% stenosed.   A drug-eluting stent was successfully placed using a SYNERGY XD 3.0X16 -deployed to 3.3 mm. Post intervention, there is a 0% residual stenosis.  TIMI-3 flow pre and post   ---------------------------------------   Mid LAD lesion is 30% stenosed with 30% stenosed side branch in 1st Diag.  Dist LAD lesion is 60% stenosed. - diffuse disease with tapering   Mid Cx to Dist Cx lesion is 60% stenosed.   ---------------------------------------   LV end diastolic pressure is normal.  There is no aortic valve stenosis.   POST-CATH DIAGNOSES Severe Single-Vessel Disease with 80% mid RCA stenosis at a bend as well as a 90% ulcerated plaque in just proximal to the bifurcation and the PDL and PLA; prox-mid RCA 40%30% & 40% stenoses.3 Successful to site PCI of the RCA: TIMI-3 flow pre and post. Distal 90% lesion reduced to 0% with a Synergy XD DES 3.0 x 12 postdilated to 3.3 mm;  Mid RCA 80% reduced to 0% with Synergy XD DES 3.0 x 16 mm deployed to 3.3 mm;    Diffuse mild to moderate disease throughout the LAD with  the tapering sleep. No focal lesion more than 60% distally. Relatively normal circumflex with exception of "ostial "AVG LCx after OM 160% (CT FFR negative) Normal LVEF      RECOMMENDATIONS Aggressive cardiovascular CRF modification with plan to follow-up seeing Dr. Carolan ClinesMary Branch DAPT per recommendations Same-day discharge _____________   History of Present Illness     Steven Huber is a 62 y.o. male with hx of HLD, presented with CP, c/f angina CCS II. He was hospitalized 07/05/22-07/07/22. Troponin elevation was mild and flat, inconsistent with ACS. His risk included HLD, age and his story of exertional CP/jaw pain.He was started on heparin initially based on his story.  No ischemic EKG. His echo was normal. We discussed that he does not have high risk ACS. Will plan for an outpatient coronary CT scan. OP coronary CTA showed plaque in all three vessels, negative FFR in LAD and LCX, FFR could not be completed in the RCA and cardiac catheterization was recommended.  Outpatient cardiac catheterization was arranged for further evaluation.  Hospital Course     The patient underwent cardiac cath as noted above with 90% distal RCA treated with 3.0 x 12 mm DES, mid RCA 80% lesion treated with 3.0 x 16 mm. Plan for DAPT with ASA/plavix. The patient was seen by cardiac rehab while in short stay. There were no observed complications post cath. Radial cath site was re-evaluated prior to discharge and found to be stable  without any complications. Instructions/precautions regarding cath site care were given prior to discharge.  Steven Huber was seen by Dr. Ellyn Hack and determined stable for discharge home. Follow up with our office has been arranged. Medications are listed below. Pertinent changes include addition of plavix and increase crestor from 20 mg to 40 mg given disease.    _____________  Cath/PCI Registry Performance & Quality Measures: Aspirin prescribed? - Yes ADP Receptor Inhibitor  (Plavix/Clopidogrel, Brilinta/Ticagrelor or Effient/Prasugrel) prescribed (includes medically managed patients)? - Yes High Intensity Statin (Lipitor 40-80mg  or Crestor 20-40mg ) prescribed? - Yes For EF <40%, was ACEI/ARB prescribed? - Not Applicable (EF >/= 63%) For EF <40%, Aldosterone Antagonist (Spironolactone or Eplerenone) prescribed? - Not Applicable (EF >/= 87%) Cardiac Rehab Phase II ordered (Included Medically managed Patients)? - Yes  _____________   Discharge Vitals Blood pressure (!) 141/87, pulse (!) 58, temperature (!) 97.2 F (36.2 C), temperature source Temporal, resp. rate 11, height 5\' 11"  (1.803 m), weight 83.9 kg, SpO2 98 %.  Filed Weights   07/22/22 0559  Weight: 83.9 kg    Last Labs & Radiologic Studies    CBC No results for input(s): "WBC", "NEUTROABS", "HGB", "HCT", "MCV", "PLT" in the last 72 hours. Basic Metabolic Panel No results for input(s): "NA", "K", "CL", "CO2", "GLUCOSE", "BUN", "CREATININE", "CALCIUM", "MG", "PHOS" in the last 72 hours. Liver Function Tests No results for input(s): "AST", "ALT", "ALKPHOS", "BILITOT", "PROT", "ALBUMIN" in the last 72 hours. No results for input(s): "LIPASE", "AMYLASE" in the last 72 hours. High Sensitivity Troponin:   Recent Labs  Lab 07/05/22 1618 07/05/22 1818 07/06/22 0005 07/06/22 0701 07/06/22 1107  TROPONINIHS 6 21* 44* 41* 37*    BNP Invalid input(s): "POCBNP" D-Dimer No results for input(s): "DDIMER" in the last 72 hours. Hemoglobin A1C No results for input(s): "HGBA1C" in the last 72 hours. Fasting Lipid Panel No results for input(s): "CHOL", "HDL", "LDLCALC", "TRIG", "CHOLHDL", "LDLDIRECT" in the last 72 hours. Thyroid Function Tests No results for input(s): "TSH", "T4TOTAL", "T3FREE", "THYROIDAB" in the last 72 hours.  Invalid input(s): "FREET3" _____________  CARDIAC CATHETERIZATION  Result Date: 07/22/2022   Prox RCA-1 lesion is 25% stenosed.  Prox RCA-2 lesion is 45% stenosed.    CULPRIT lesion: Dist RCA lesion is 90% stenosed.   A drug-eluting stent was successfully placed using a SYNERGY XD 3.0X12. -Deployed to 3.3 mm.  Post intervention, there is a 0% residual stenosis.  TIMI-3 flow pre and post   Mid RCA lesion is 80% stenosed.   A drug-eluting stent was successfully placed using a SYNERGY XD 3.0X16 -deployed to 3.3 mm. Post intervention, there is a 0% residual stenosis.  TIMI-3 flow pre and post   ---------------------------------------   Mid LAD lesion is 30% stenosed with 30% stenosed side branch in 1st Diag.  Dist LAD lesion is 60% stenosed. - diffuse disease with tapering   Mid Cx to Dist Cx lesion is 60% stenosed.   ---------------------------------------   LV end diastolic pressure is normal.  There is no aortic valve stenosis. POST-CATH DIAGNOSES Severe Single-Vessel Disease with 80% mid RCA stenosis at a bend as well as a 90% ulcerated plaque in just proximal to the bifurcation and the PDL and PLA; prox-mid RCA 30% & 40% stenoses.3 Successful to site PCI of the RCA: TIMI-3 flow pre and post. Distal 90% lesion reduced to 0% with a Synergy XD DES 3.0 x 12 postdilated to 3.3 mm; Mid RCA 80% reduced to 0% with Synergy XD DES 3.0 x 16  mm deployed to 3.3 mm; Diffuse mild to moderate disease throughout the LAD with the tapering sleep. No focal lesion more than 60% distally. Relatively normal circumflex with exception of "ostial "AVG LCx after OM 160% (CT FFR negative) Normal LVEF RECOMMENDATIONS Aggressive cardiovascular CRF modification with plan to follow-up seeing Dr. Carolan Clines DAPT per recommendations Same-day discharge Bryan Lemma, MD  CT CORONARY [facility] W/CTA COR W/SCORE [facility] W/CM &/OR WO/CM  Addendum Date: 07/16/2022   ADDENDUM REPORT: 07/16/2022 13:17 EXAM: OVER-READ INTERPRETATION  CT CHEST The following report is an over-read performed by radiologist Dr. Neita Garnet of [facility] on 07/16/2022. This over-read does not include interpretation of cardiac or  coronary anatomy or pathology. The coronary calcium score/coronary CTA interpretation by the cardiologist is attached. COMPARISON:  Chest two views 02/30/2023 FINDINGS: Cardiovascular: There are no significant extracardiac vascular findings. Mediastinum/Nodes: There are no enlarged lymph nodes within the visualized mediastinum. Lungs/Pleura: There is no pleural effusion. Mild bilateral posterior dependent subsegmental atelectasis. No acute process within the visualized lower lungs. Upper abdomen: No significant findings in the visualized upper abdomen. Musculoskeletal/Chest wall: No chest wall mass or suspicious osseous findings within the visualized chest. IMPRESSION: No significant extracardiac findings within the visualized chest. Electronically Signed   By: Neita Garnet M.D.   On: 07/16/2022 13:17   Result Date: 07/16/2022 CLINICAL DATA:  Chest pain EXAM: Cardiac/Coronary CTA TECHNIQUE: A non-contrast, gated CT scan was obtained with axial slices of 3 mm through the heart for calcium scoring. Calcium scoring was performed using the Agatston method. A 120 kV prospective, gated, contrast cardiac scan was obtained. Gantry rotation speed was 250 msecs and collimation was 0.6 mm. Two sublingual nitroglycerin tablets (0.8 mg) were given. The 3D data set was reconstructed in 5% intervals of the 35-75% of the R-R cycle. Diastolic phases were analyzed on a dedicated workstation using MPR, MIP, and VRT modes. The patient received 95 cc of contrast. FINDINGS: Image quality: Excellent. Noise artifact is: Limited. Coronary Arteries:  Normal coronary origin.  Right dominance. Left main: The left main is a large caliber vessel with a normal take off from the left coronary cusp that bifurcates to form a left anterior descending artery and a left circumflex artery. There is minimal calcified plaque in the distal left main. Left anterior descending artery: The proximal to mid LAD contains mild to moderate mixed density plaque  (40-50%). The distal LAD is patent. D1 contains mild to moderate plaque (40-50%). Left circumflex artery: The LCX is non-dominant. The LCX contains moderate mixed density plaque (50-69%) in the mid segment. OM1 contains minimal non-calcified plaque (<25%). OM2 contains mild non-calcified plaque (25-49%). Right coronary artery: The RCA is dominant with normal take off from the right coronary cusp. The proximal RCA contains mild mixed density plaque (25-49%). The mid RCA contains a severe mixed density plaque (70-99%). The distal RCA contains severe non-calcified plaque (70-99%). Right Atrium: Right atrial size is within normal limits. Right Ventricle: The right ventricular cavity is within normal limits. Left Atrium: Left atrial size is normal in size with no left atrial appendage filling defect. Left Ventricle: The ventricular cavity size is within normal limits. Pulmonary arteries: Normal in size without proximal filling defect. Pulmonary veins: Normal pulmonary venous drainage. Pericardium: Normal thickness without significant effusion or calcium present. Cardiac valves: The aortic valve is trileaflet without significant calcification. The mitral valve is normal without significant calcification. Aorta: Normal caliber without significant disease. Extra-cardiac findings: See attached radiology report for non-cardiac structures. IMPRESSION:  1. Coronary calcium score of 789. This was 96th percentile for age-, sex, and race-matched controls. 2. Normal coronary origin with right dominance. 3. Proximal to mid LAD contains mild to moderate plaque (40-50%). 4. Moderate mixed density plaque (50-69%) in the mid LCX. 5. Severe serial stenoses in the mid and distal RCA (70-99%). RECOMMENDATIONS: 1. Severe stenosis in the RCA (70-99%). CT FFR will be sent. Consider symptom-guided anti-ischemic pharmacotherapy as well as risk factor modification per guideline directed care. Invasive coronary angiography recommended with  revascularization per published guideline statements. Eleonore Chiquito, MD Electronically Signed: By: Eleonore Chiquito M.D. On: 07/16/2022 12:01   CT CORONARY FFR DATA PREP & FLUID ANALYSIS  Result Date: 07/16/2022 EXAM: CT FFR analysis was performed on the original cardiac CTA dataset. Diagrammatic representation of the CT FFR analysis is provided in a separate PDF document in PACS. This dictation was created using the PDF document and an interactive 3D model of the results. The 3D model is not available in the EMR/PACS. INTERPRETATION: CT FFR provides simultaneous calculation of pressure and flow across the entire coronary tree. For clinical decision making, CT FFR values should be obtained 1-2 cm distal to the lower border of each stenosis measured. Coronary CTA-related artifacts may impair the diagnostic accuracy of the original cardiac CTA and FFR CT results. *Due to the fact that CT FFR represents a mathematically-derived analysis, it is recommended that the results be interpreted as follows: 1. CT FFR >0.80: Low likelihood of hemodynamic significance. 2. CT FFR 0.76-0.80: Borderline likelihood of hemodynamic significance. 3. CT FFR =< 0.75: High likelihood of hemodynamic significance. *Coronary CT Angiography-derived Fractional Flow Reserve Testing in Patients with Stable Coronary Artery Disease: Recommendations on Interpretation and Reporting. Radiology: Cardiothoracic Imaging. 2019;1(5):e190050 FINDINGS: 1. Left Main: 0.99; low likelihood of hemodynamic significance. 2. Prox LAD: 0.94; low likelihood of hemodynamic significance. 3. Mid LAD: 0.87; low likelihood of hemodynamic significance. 4. Distal LAD: 0.79; borderline likelihood of hemodynamic significance. Likely tapering artifact (delta 0.08). 5. D1: 0.82; low likelihood of hemodynamic significance. 6. Proximal LCX: 0.90; low likelihood of hemodynamic significance. 7. RCA: Non-diagnostic due to cardiac motion. IMPRESSION: 1.  LAD and LCX are negative by  CT FFR. 2.  RCA unable to analyzed due to slab artifact. 3. Cardiac catheterization would be recommended to evaluate the RCA since it cannot be analyzed by CT FFR. Eleonore Chiquito, MD Electronically Signed   By: Eleonore Chiquito M.D.   On: 07/16/2022 12:39   ECHOCARDIOGRAM COMPLETE  Result Date: 07/06/2022    ECHOCARDIOGRAM REPORT   Patient Name:   Steven Huber Date of Exam: 07/06/2022 Medical Rec #:  [id number]      Height:       71.0 in Accession #:    [id number]     Weight:       188.7 lb Date of Birth:  1960/07/24      BSA:          2.057 m Patient Age:    57 years       BP:           124/86 mmHg Patient Gender: M              HR:           61 bpm. Exam Location:  Inpatient Procedure: 2D Echo, Cardiac Doppler and Color Doppler Indications:    Chest pain  History:        Patient has no prior history of Echocardiogram examinations.  Signs/Symptoms:Chest Pain; Risk Factors:Dyslipidemia. GERD.                 Elevated troponin.  Sonographer:    Ross LudwigArthur Guy RDCS (AE) Referring Phys: Carolan ClinesBRANCH, MARY, E IMPRESSIONS  1. Left ventricular ejection fraction, by estimation, is 60 to 65%. The left ventricle has normal function. The left ventricle has no regional wall motion abnormalities. There is mild concentric left ventricular hypertrophy. Left ventricular diastolic parameters were normal.  2. Right ventricular systolic function is normal. The right ventricular size is normal.  3. The mitral valve is normal in structure. No evidence of mitral valve regurgitation.  4. The aortic valve is tricuspid. Aortic valve regurgitation is not visualized. Aortic valve sclerosis/calcification is present, without any evidence of aortic stenosis.  5. Aortic dilatation noted. There is mild dilatation of the aortic root, measuring 38 mm. FINDINGS  Left Ventricle: Left ventricular ejection fraction, by estimation, is 60 to 65%. The left ventricle has normal function. The left ventricle has no regional wall motion abnormalities.  The left ventricular internal cavity size was normal in size. There is  mild concentric left ventricular hypertrophy. Left ventricular diastolic parameters were normal. Right Ventricle: The right ventricular size is normal. No increase in right ventricular wall thickness. Right ventricular systolic function is normal. Left Atrium: Left atrial size was normal in size. Right Atrium: Right atrial size was normal in size. Prominent Chiari network. Pericardium: There is no evidence of pericardial effusion. Mitral Valve: The mitral valve is normal in structure. No evidence of mitral valve regurgitation. Tricuspid Valve: The tricuspid valve is normal in structure. Tricuspid valve regurgitation is not demonstrated. Aortic Valve: The aortic valve is tricuspid. Aortic valve regurgitation is not visualized. Aortic valve sclerosis/calcification is present, without any evidence of aortic stenosis. Aortic valve mean gradient measures 3.0 mmHg. Aortic valve peak gradient measures 5.3 mmHg. Aortic valve area, by VTI measures 3.82 cm. Pulmonic Valve: The pulmonic valve was normal in structure. Pulmonic valve regurgitation is not visualized. Aorta: Aortic dilatation noted. There is mild dilatation of the aortic root, measuring 38 mm. IAS/Shunts: The interatrial septum appears to be lipomatous. No atrial level shunt detected by color flow Doppler.  LEFT VENTRICLE PLAX 2D LVIDd:         3.80 cm   Diastology LVIDs:         2.50 cm   LV e' medial:    5.87 cm/s LV PW:         1.20 cm   LV E/e' medial:  13.2 LV IVS:        1.20 cm   LV e' lateral:   9.57 cm/s LVOT diam:     2.20 cm   LV E/e' lateral: 8.1 LV SV:         89 LV SV Index:   43 LVOT Area:     3.80 cm  RIGHT VENTRICLE             IVC RV Basal diam:  2.90 cm     IVC diam: 1.00 cm RV S prime:     13.90 cm/s TAPSE (M-mode): 2.2 cm LEFT ATRIUM             Index        RIGHT ATRIUM           Index LA diam:        3.10 cm 1.51 cm/m   RA Area:     13.50 cm LA Vol (A2C):   49.7 ml  24.16 ml/m  RA Volume:   26.60 ml  12.93 ml/m LA Vol (A4C):   34.8 ml 16.92 ml/m LA Biplane Vol: 43.5 ml 21.15 ml/m  AORTIC VALVE AV Area (Vmax):    3.87 cm AV Area (Vmean):   3.64 cm AV Area (VTI):     3.82 cm AV Vmax:           115.00 cm/s AV Vmean:          78.500 cm/s AV VTI:            0.233 m AV Peak Grad:      5.3 mmHg AV Mean Grad:      3.0 mmHg LVOT Vmax:         117.00 cm/s LVOT Vmean:        75.100 cm/s LVOT VTI:          0.234 m LVOT/AV VTI ratio: 1.00  AORTA Ao Root diam: 3.80 cm Ao Asc diam:  3.20 cm MITRAL VALVE MV Area (PHT): 3.03 cm    SHUNTS MV Decel Time: 250 msec    Systemic VTI:  0.23 m MV E velocity: 77.60 cm/s  Systemic Diam: 2.20 cm MV A velocity: 65.40 cm/s MV E/A ratio:  1.19 Mihai Croitoru MD Electronically signed by Thurmon Fair MD Signature Date/Time: 07/06/2022/1:21:55 PM    Final    DG Chest 2 View  Result Date: 07/05/2022 CLINICAL DATA:  Chest pain EXAM: CHEST - 2 VIEW COMPARISON:  None Available. FINDINGS: The heart size and mediastinal contours are within normal limits. Both lungs are clear. The visualized skeletal structures are unremarkable. IMPRESSION: No active cardiopulmonary disease. Electronically Signed   By: Duanne Guess D.O.   On: 07/05/2022 16:52    Disposition   Pt is being discharged home today in good condition.  Follow-up Plans & Appointments     Discharge Instructions     AMB Referral to Cardiac Rehabilitation - Phase II   Complete by: As directed    Diagnosis:  Stable Angina Coronary Stents     After initial evaluation and assessments completed: Virtual Based Care may be provided alone or in conjunction with Phase 2 Cardiac Rehab based on patient barriers.: Yes   Intensive Cardiac Rehabilitation (ICR) [facility] location only OR Traditional Cardiac Rehabilitation (TCR) *If criteria for ICR are not met will enroll in TCR [facility] only): Yes        Discharge Medications   Allergies as of 07/22/2022   No Known Allergies       Medication List     STOP taking these medications    omeprazole 20 MG capsule Commonly known as: PRILOSEC       TAKE these medications    Allopurinol 200 MG Tabs Take 200 mg by mouth daily.   Aspirin Low Dose 81 MG tablet Generic drug: aspirin EC Take 1 tablet (81 mg total) by mouth daily. Swallow whole. What changed: additional instructions   clopidogrel 75 MG tablet Commonly known as: Plavix Take 1 tablet (75 mg total) by mouth daily.   fluticasone 50 MCG/ACT nasal spray Commonly known as: FLONASE Place 1 spray into both nostrils daily.   nitroGLYCERIN 0.4 MG SL tablet Commonly known as: NITROSTAT Place 1 tablet (0.4 mg total) under the tongue every 5 (five) minutes as needed for chest pain.   pantoprazole 40 MG tablet Commonly known as: Protonix Take 1 tablet (40 mg total) by mouth daily.   rosuvastatin 20 MG tablet Commonly known as: CRESTOR Take 1  tablet (20 mg total) by mouth every morning.   tamsulosin 0.4 MG Caps capsule Commonly known as: FLOMAX Take 0.4 mg by mouth daily.           Allergies No Known Allergies  Outstanding Labs/Studies   none  Duration of Discharge Encounter   Greater than 30 minutes including physician time.  Signed, Roe Rutherford Zali Kamaka, PA 07/22/2022, 11:37 AM

## 2022-07-22 NOTE — Progress Notes (Signed)
Pt was educated on stent card, stent location,Plavix and ASA use, Plavix if needed, wt restrictions, no baths/daily wash-ups, s/s of infection, ex guidelines (progressive walking and resistance exercise), s/s to stop exercising, NTG use and calling 911, heart healthy diet,  and CRPII. Pt received materials on exercise, diet, and CRPII. Will refer to [facility].    [id number]

## 2022-07-22 NOTE — Interval H&P Note (Signed)
History and Physical Interval Note:  07/22/2022 7:17 AM   Pt was discharged for OutPT evaluation with Coronary CTA -- Results showed borderline disease in LCA system, but RCA was not able to be modeled.  - Cath Recommended.  Per Dr. Harl Bowie:  Called Steven Huber. Discussed his CT scan. Concern for RCA disease. He is amenable to [facility].   Shared Decision Making/Informed Consent The risks [stroke (1 in 1000), death (1 in 1000), kidney failure [usually temporary] (1 in 500), bleeding (1 in 200), allergic reaction [possibly serious] (1 in 200)], benefits (diagnostic support and management of coronary artery disease) and alternatives of a cardiac catheterization were discussed in detail with Steven Huber and he is willing to proceed.        Steven Huber  has presented today for surgery, with the diagnosis of chest pain, abnormal CTA.  The various methods of treatment have been discussed with the patient and family. After consideration of risks, benefits and other options for treatment, the patient has consented to  Procedure(s): LEFT HEART CATH AND CORONARY ANGIOGRAPHY (N/A) PERCUTANEOUS CORONARY INTERVENTION    as a surgical intervention.  The patient's history has been reviewed, patient examined, no change in status, stable for surgery.  I have reviewed the patient's chart and labs.  Questions were answered to the patient's satisfaction.     Cath Lab Visit (complete for each Cath Lab visit)  Clinical Evaluation Leading to the Procedure:   ACS: No.  Non-ACS:    Anginal Classification: CCS III  Anti-ischemic medical therapy: Minimal Therapy (1 class of medications)  Non-Invasive Test Results: No non-invasive testing performed  Prior CABG: No previous CABG    Glenetta Hew

## 2022-07-22 NOTE — Progress Notes (Signed)
Patient and wife was given discharge instructions. Both verbalized understanding. 

## 2022-07-22 NOTE — Discharge Instructions (Signed)

## 2022-07-28 NOTE — Progress Notes (Unsigned)
Cardiology Office Note:    Date:  07/30/2022   ID:  Steven Huber, DOB 1961-07-03, MRN [id number]  PCP:  Caren Macadam, [place] Providers Cardiologist:  Janina Mayo, MD Cardiology APP:  Ledora Bottcher, [place] { Referring MD: Caren Macadam, MD   Chief Complaint  Patient presents with   Follow-up    Post cath    History of Present Illness:    Steven Huber is a 62 y.o. male with a hx of CAD, hyperlipidemia, and BPH.  He was hospitalized 07/05/2022 - 07/07/2022 with chest pain concerning for angina.  Troponin elevation was mild and flat inconsistent with ACS.  He does have risk factors including age, hyperlipidemia, and symptoms.  Outpatient coronary CTA was planned and showed three-vessel disease with negative FFR in the LAD and LCx, FFR was not completed in the RCA.  He was recommended for outpatient cardiac catheterization which was performed on 07/22/2022.  LHC showed 90% distal RCA lesion treated with a 3.0 x 12 mm DES, mid RCA 80% lesion treated with a 3.0 x 16 mm DES.  He was placed on DAPT with aspirin and Plavix.  His Crestor was not increased from 20 to 40 mg.  He presents today for TOC follow-up. He is doing well and has started walking. He had an exercise program prior to PCI. I suggested waiting one additional week prior to upper body weights. He will obtain a BP cuff - BP is borderline today.    Past Medical History:  Diagnosis Date   GERD (gastroesophageal reflux disease)    Hyperlipidemia     Past Surgical History:  Procedure Laterality Date   CORONARY STENT INTERVENTION N/A 07/22/2022   Procedure: CORONARY STENT INTERVENTION;  Surgeon: Leonie Man, MD;  Location: [place] [facility];  Service: Cardiovascular;  Laterality: N/A;   LAPAROSCOPIC APPENDECTOMY N/A 05/26/2014   Procedure: APPENDECTOMY LAPAROSCOPIC;  Surgeon: Donnie Mesa, MD;  Location: [place];  Service: General;  Laterality: N/A;   LEFT HEART CATH AND CORONARY ANGIOGRAPHY N/A  07/22/2022   Procedure: LEFT HEART CATH AND CORONARY ANGIOGRAPHY;  Surgeon: Leonie Man, MD;  Location: [place] [facility];  Service: Cardiovascular;  Laterality: N/A;    Current Medications: Current Meds  Medication Sig   Allopurinol 200 MG TABS Take 200 mg by mouth daily.   aspirin EC 81 MG tablet Take 1 tablet (81 mg total) by mouth daily. Swallow whole.   clopidogrel (PLAVIX) 75 MG tablet Take 1 tablet (75 mg total) by mouth daily.   fluticasone (FLONASE) 50 MCG/ACT nasal spray Place 1 spray into both nostrils daily.   nitroGLYCERIN (NITROSTAT) 0.4 MG SL tablet Place 1 tablet (0.4 mg total) under the tongue every 5 (five) minutes as needed for chest pain.   pantoprazole (PROTONIX) 40 MG tablet Take 1 tablet (40 mg total) by mouth daily.   rosuvastatin (CRESTOR) 20 MG tablet Take 1 tablet (20 mg total) by mouth in the morning.   tamsulosin (FLOMAX) 0.4 MG CAPS capsule Take 0.4 mg by mouth daily.     Allergies:   Patient has no known allergies.   Social History   Socioeconomic History   Marital status: Married    Spouse name: Not on file   Number of children: Not on file   Years of education: Not on file   Highest education level: Not on file  Occupational History   Not on file  Tobacco Use   Smoking status:  Never   Smokeless tobacco: Never  Substance and Sexual Activity   Alcohol use: Yes    Comment: few beers a night at least 4 night a week.   Drug use: No   Sexual activity: Yes  Other Topics Concern   Not on file  Social History Narrative   Not on file   Social Determinants of Health   Financial Resource Strain: Not on file  Food Insecurity: No Food Insecurity (07/06/2022)   Hunger Vital Sign    Worried About Running Out of Food in the Last Year: Never true    Ran Out of Food in the Last Year: Never true  Transportation Needs: No Transportation Needs (07/06/2022)   PRAPARE - Administrator, Civil Service (Medical): No    Lack of Transportation  (Non-Medical): No  Physical Activity: Not on file  Stress: Not on file  Social Connections: Not on file     Family History: The patient's family history includes Diabetes in his mother; Hypertension in his mother.  ROS:   Please see the history of present illness.     All other systems reviewed and are negative.  EKGs/Labs/Other Studies Reviewed:    The following studies were reviewed today:  Cardiac Catheterization 07/22/2022:     Prox RCA-1 lesion is 25% stenosed.  Prox RCA-2 lesion is 45% stenosed.   CULPRIT lesion: Dist RCA lesion is 90% stenosed.   A drug-eluting stent was successfully placed using a SYNERGY XD 3.0X12. -Deployed to 3.3 mm.  Post intervention, there is a 0% residual stenosis.  TIMI-3 flow pre and post   Mid RCA lesion is 80% stenosed.   A drug-eluting stent was successfully placed using a SYNERGY XD 3.0X16 -deployed to 3.3 mm. Post intervention, there is a 0% residual stenosis.  TIMI-3 flow pre and post   ---------------------------------------   Mid LAD lesion is 30% stenosed with 30% stenosed side branch in 1st Diag.  Dist LAD lesion is 60% stenosed. - diffuse disease with tapering   Mid Cx to Dist Cx lesion is 60% stenosed.   ---------------------------------------   LV end diastolic pressure is normal.  There is no aortic valve stenosis.   POST-CATH DIAGNOSES Severe Single-Vessel Disease with 80% mid RCA stenosis at a bend as well as a 90% ulcerated plaque in just proximal to the bifurcation and the PDL and PLA; prox-mid RCA 70% & 40% stenoses.3 Successful to site PCI of the RCA: TIMI-3 flow pre and post. Distal 90% lesion reduced to 0% with a Synergy XD DES 3.0 x 12 postdilated to 3.3 mm;  Mid RCA 80% reduced to 0% with Synergy XD DES 3.0 x 16 mm deployed to 3.3 mm;    Diffuse mild to moderate disease throughout the LAD with the tapering sleep. No focal lesion more than 60% distally. Relatively normal circumflex with exception of "ostial "AVG LCx after OM  160% (CT FFR negative) Normal LVEF      RECOMMENDATIONS Aggressive cardiovascular CRF modification with plan to follow-up seeing Dr. Carolan Clines DAPT per recommendations Same-day discharge  EKG:  EKG is not ordered today.    Recent Labs: 07/07/2022: ALT 111; BUN 11; Creatinine, Ser 1.14; Hemoglobin 14.2; Magnesium 2.1; Platelets 175; Potassium 3.9; Sodium 136  Recent Lipid Panel    Component Value Date/Time   CHOL 184 07/06/2022 1107   TRIG 133 07/06/2022 1107   HDL 57 07/06/2022 1107   CHOLHDL 3.2 07/06/2022 1107   VLDL 27 07/06/2022 1107   LDLCALC 100 (  H) 07/06/2022 1107     Risk Assessment/Calculations:                Physical Exam:    VS:  BP 130/88   Pulse 64   Ht 5\' 11"  (1.803 m)   Wt 190 lb 3.2 oz (86.3 kg)   SpO2 98%   BMI 26.53 kg/m     Wt Readings from Last 3 Encounters:  07/30/22 190 lb 3.2 oz (86.3 kg)  07/22/22 185 lb (83.9 kg)  07/06/22 188 lb 11.2 oz (85.6 kg)     GEN:  Well nourished, well developed in no acute distress HEENT: Normal NECK: No JVD; No carotid bruits LYMPHATICS: No lymphadenopathy CARDIAC: RRR, no murmurs, rubs, gallops RESPIRATORY:  Clear to auscultation without rales, wheezing or rhonchi  ABDOMEN: Soft, non-tender, non-distended MUSCULOSKELETAL:  No edema; No deformity  SKIN: Warm and dry NEUROLOGIC:  Alert and oriented x 3 PSYCHIATRIC:  Normal affect  Right radial cath site C/D/I with expected bruising  ASSESSMENT:    1. Hyperlipidemia with target LDL less than 70   2. Mixed hyperlipidemia   3. CAD S/P percutaneous coronary angioplasty   4. Primary hypertension   5. Prediabetes    PLAN:    In order of problems listed above:  CAD DES x 2-RCA 07/22/2022 DAPT with aspirin and Plavix PRN nitro   Hyperlipidemia with LDL goal < 70 07/06/2022: Cholesterol 184; HDL 57; LDL Cholesterol 100; Triglycerides 133; VLDL 27 He was not taking Crestor consistently. He reports a history of elevated LFTs. He will start 20 mg  crestor daily. We will recheck lipids and Lfts in 6 weeks. May ultimately need a PCSK9i given elevated LP(a).    Hypertension BP borderline today He will obtain a BP cuff and call if consistently above 130/80.  Low threshold to start amlodipine. Although A1c is 5.9% - could consider losartan.    Prediabetes He will reactive his exercise routine. Will need to follow closely. Would institute ARB if BP control needed.     Cardiac Rehabilitation Eligibility Assessment  The patient is ready to start cardiac rehabilitation from a cardiac standpoint.          Medication Adjustments/Labs and Tests Ordered: Current medicines are reviewed at length with the patient today.  Concerns regarding medicines are outlined above.  Orders Placed This Encounter  Procedures   Hepatic function panel   Lipid panel   No orders of the defined types were placed in this encounter.   Patient Instructions  Medication Instructions:  Your physician recommends that you continue on your current medications as directed. Please refer to the Current Medication list given to you today.  *If you need a refill on your cardiac medications before your next appointment, please call your pharmacy*  Please purchase an OMRON blood pressure cuff.  Please take your blood pressure daily and if your readings are consistently 130/80 or greater please contact the office.    Lab Work: Your physician recommends that you return for lab work in 6 weeks to have LFT's and a Lipid panel drawn.  If you have labs (blood work) drawn today and your tests are completely normal, you will receive your results only by: MyChart Message (if you have MyChart) OR A paper copy in the mail If you have any lab test that is abnormal or we need to change your treatment, we will call you to review the results.   Testing/Procedures: NONE   Follow-Up: At [facility], you and  your health needs are our priority.  As part of our  continuing mission to provide you with exceptional heart care, we have created designated Provider Care Teams.  These Care Teams include your primary Cardiologist (physician) and Advanced Practice Providers (APPs -  Physician Assistants and Nurse Practitioners) who all work together to provide you with the care you need, when you need it.  We recommend signing up for the patient portal called "MyChart".  Sign up information is provided on this After Visit Summary.  MyChart is used to connect with patients for Virtual Visits (Telemedicine).  Patients are able to view lab/test results, encounter notes, upcoming appointments, etc.  Non-urgent messages can be sent to your provider as well.   To learn more about what you can do with MyChart, go to [email or web address].    Your next appointment:   3 month(s)  Provider:   Janina Mayo, MD     Signed, [place], [place]  07/30/2022 4:39 PM    [place]

## 2022-07-30 ENCOUNTER — Encounter: Payer: Self-pay | Admitting: Physician Assistant

## 2022-07-30 ENCOUNTER — Ambulatory Visit: Payer: BC Managed Care – PPO | Attending: Physician Assistant | Admitting: Physician Assistant

## 2022-07-30 VITALS — BP 130/88 | HR 64 | Ht 71.0 in | Wt 190.2 lb

## 2022-07-30 DIAGNOSIS — I10 Essential (primary) hypertension: Secondary | ICD-10-CM

## 2022-07-30 DIAGNOSIS — I2510 Atherosclerotic heart disease of native coronary artery without angina pectoris: Secondary | ICD-10-CM

## 2022-07-30 DIAGNOSIS — E782 Mixed hyperlipidemia: Secondary | ICD-10-CM

## 2022-07-30 DIAGNOSIS — E785 Hyperlipidemia, unspecified: Secondary | ICD-10-CM | POA: Diagnosis not present

## 2022-07-30 DIAGNOSIS — R7303 Prediabetes: Secondary | ICD-10-CM

## 2022-07-30 DIAGNOSIS — Z9861 Coronary angioplasty status: Secondary | ICD-10-CM

## 2022-07-30 NOTE — Patient Instructions (Signed)
Medication Instructions:  Your physician recommends that you continue on your current medications as directed. Please refer to the Current Medication list given to you today.  *If you need a refill on your cardiac medications before your next appointment, please call your pharmacy*  Please purchase an OMRON blood pressure cuff.  Please take your blood pressure daily and if your readings are consistently 130/80 or greater please contact the office.    Lab Work: Your physician recommends that you return for lab work in 6 weeks to have LFT's and a Lipid panel drawn.  If you have labs (blood work) drawn today and your tests are completely normal, you will receive your results only by: [place] (if you have MyChart) OR A paper copy in the mail If you have any lab test that is abnormal or we need to change your treatment, we will call you to review the results.   Testing/Procedures: NONE   Follow-Up: At [facility], you and your health needs are our priority.  As part of our continuing mission to provide you with exceptional heart care, we have created designated Provider Care Teams.  These Care Teams include your primary Cardiologist (physician) and Advanced Practice Providers (APPs -  Physician Assistants and Nurse Practitioners) who all work together to provide you with the care you need, when you need it.  We recommend signing up for the patient portal called "MyChart".  Sign up information is provided on this After Visit Summary.  MyChart is used to connect with patients for Virtual Visits (Telemedicine).  Patients are able to view lab/test results, encounter notes, upcoming appointments, etc.  Non-urgent messages can be sent to your provider as well.   To learn more about what you can do with MyChart, go to [email or web address].    Your next appointment:   3 month(s)  Provider:   Janina Mayo, MD

## 2022-08-12 ENCOUNTER — Telehealth: Payer: Self-pay | Admitting: Internal Medicine

## 2022-08-12 NOTE — Telephone Encounter (Signed)
Pt c/o medication issue:  1. Name of Medication: pantoprazole (PROTONIX) 40 MG tablet   2. How are you currently taking this medication (dosage and times per day)? As prescribed   3. Are you having a reaction (difficulty breathing--STAT)? Yes  4. What is your medication issue? Patient states this medication seems to be upsetting his stomach and would like a call back to discuss.

## 2022-08-12 NOTE — Telephone Encounter (Signed)
Called pt she states. He was taking Omeprazole and it was his daily medication. "I take the Protonix in the morning and I will have the worst upset stomach all day. I don't know why I was taken off the Omeprazole."; "I just don't know if I can continue to take this Protonix."

## 2022-08-15 NOTE — Telephone Encounter (Signed)
Tried calling patient. No answer, left detailed message. Told pt to return the call if he would like the new medication (Dexilant) called it.

## 2022-08-19 NOTE — Telephone Encounter (Signed)
Message sent to PCP via [place] fax

## 2022-08-19 NOTE — Telephone Encounter (Signed)
Pt c/o medication issue:  1. Name of Medication: Dexilant  2. How are you currently taking this medication (dosage and times per day)?   Not taking  3. Are you having a reaction (difficulty breathing--STAT)?   4. What is your medication issue?   Wife stated patient would like to try this medication and would like a prescription sent  to [id number] [place], [place] - [address]  Wife would like a call back to confirm this medication change request.

## 2022-08-21 ENCOUNTER — Other Ambulatory Visit: Payer: Self-pay | Admitting: Internal Medicine

## 2022-08-21 DIAGNOSIS — K219 Gastro-esophageal reflux disease without esophagitis: Secondary | ICD-10-CM

## 2022-08-21 MED ORDER — DEXLANSOPRAZOLE 30 MG PO CPDR: 30.0000 mg | DELAYED_RELEASE_CAPSULE | Freq: Every day | ORAL | 0 refills | Status: DC

## 2022-08-22 NOTE — Telephone Encounter (Signed)
RX sent by MD.  Thanks!

## 2022-08-25 ENCOUNTER — Other Ambulatory Visit: Payer: Self-pay | Admitting: Internal Medicine

## 2022-08-25 ENCOUNTER — Other Ambulatory Visit: Payer: Self-pay

## 2022-08-25 DIAGNOSIS — K219 Gastro-esophageal reflux disease without esophagitis: Secondary | ICD-10-CM

## 2022-08-25 MED ORDER — DEXLANSOPRAZOLE 30 MG PO CPDR: 30.0000 mg | DELAYED_RELEASE_CAPSULE | Freq: Every day | ORAL | 3 refills | Status: AC

## 2022-08-26 ENCOUNTER — Other Ambulatory Visit: Payer: Self-pay | Admitting: Internal Medicine

## 2022-08-26 DIAGNOSIS — K219 Gastro-esophageal reflux disease without esophagitis: Secondary | ICD-10-CM

## 2022-08-26 NOTE — Telephone Encounter (Signed)
Patient currently taking Dexilant

## 2022-08-28 ENCOUNTER — Telehealth (HOSPITAL_COMMUNITY): Payer: Self-pay

## 2022-08-28 NOTE — Telephone Encounter (Signed)
Called and spoke with pt in regards to CR, pt stated he is not able to participate at this time due to his work schedule.   Closed referral

## 2022-10-21 ENCOUNTER — Ambulatory Visit: Payer: BC Managed Care – PPO | Admitting: Internal Medicine

## 2022-10-22 ENCOUNTER — Encounter: Payer: Self-pay | Admitting: Internal Medicine

## 2022-10-22 ENCOUNTER — Ambulatory Visit: Payer: BC Managed Care – PPO | Attending: Internal Medicine | Admitting: Internal Medicine

## 2022-10-22 VITALS — BP 135/80 | HR 63 | Ht 71.0 in | Wt 189.0 lb

## 2022-10-22 DIAGNOSIS — E785 Hyperlipidemia, unspecified: Secondary | ICD-10-CM | POA: Diagnosis not present

## 2022-10-22 DIAGNOSIS — I2510 Atherosclerotic heart disease of native coronary artery without angina pectoris: Secondary | ICD-10-CM | POA: Diagnosis not present

## 2022-10-22 DIAGNOSIS — Z9861 Coronary angioplasty status: Secondary | ICD-10-CM

## 2022-10-22 MED ORDER — METOPROLOL SUCCINATE ER 25 MG PO TB24: 25.0000 mg | ORAL_TABLET | Freq: Every day | ORAL | 3 refills | Status: DC

## 2022-10-22 MED ORDER — CLOPIDOGREL BISULFATE 75 MG PO TABS: 75.0000 mg | ORAL_TABLET | Freq: Every day | ORAL | 0 refills | Status: DC

## 2022-10-22 NOTE — Progress Notes (Signed)
Cardiology Office Note:    Date:  10/22/2022   ID:  Steven Huber, DOB September 14, 1960, MRN [id number]  PCP:  [name], MD   [facility] Providers Cardiologist:  [name], MD Cardiology APP:  [name], [place]     Referring MD: [name], MD   No chief complaint on file. CAD, Hospital FU  History of Present Illness:    Steven Huber is a 62 y.o. male with a hx of GERD, CAD here for FU.  He was admitted 07/22/2022 to [facility] hospital. presented with CP, c/f angina CCS II. He was hospitalized 07/05/22-07/07/22. Troponin elevation was mild and flat, inconsistent with ACS. His risk included HLD, age and his story of exertional CP/jaw pain.He was started on heparin initially based on his story.  No ischemic EKG. His echo was normal. Normal LVEF/RV function, no significant valve disease.  No pulmonary HTN. We discussed that he does not have high risk ACS. Will plan for an outpatient coronary CT scan. OP coronary CTA showed plaque in all three vessels, negative FFR in LAD and LCX, FFR could not be completed in the RCA and cardiac catheterization was recommended.The patient underwent cardiac cath as noted above with 90% distal RCA treated with 3.0 x 12 mm DES, mid RCA 80% lesion treated with 3.0 x 16 mm. Plan for DAPT with ASA/plavix. Normal renal function.    Today he reports he has been doing well. He's been off plavix for a month, states prescription ran out. He notes some stomach upset with medications. He is exercising more. He likes to run. He runs on the treadmill.  He denies CP or SOB.    Past Medical History:  Diagnosis Date   GERD (gastroesophageal reflux disease)    Hyperlipidemia     Past Surgical History:  Procedure Laterality Date   CORONARY STENT INTERVENTION N/A 07/22/2022   Procedure: CORONARY STENT INTERVENTION;  Surgeon: [name], MD;  Location: [facility];  Service: Cardiovascular;  Laterality: N/A;   LAPAROSCOPIC APPENDECTOMY N/A  05/26/2014   Procedure: APPENDECTOMY LAPAROSCOPIC;  Surgeon: [name], MD;  Location: [facility];  Service: General;  Laterality: N/A;   LEFT HEART CATH AND CORONARY ANGIOGRAPHY N/A 07/22/2022   Procedure: LEFT HEART CATH AND CORONARY ANGIOGRAPHY;  Surgeon: [name], MD;  Location: [facility] INVAS[facility];  Service: Cardiovascular;  Laterality: N/A;    Current Medications: Current Meds  Medication Sig   aspirin EC 81 MG tablet Take 1 tablet (81 mg total) by mouth daily. Swallow whole.   Dexlansoprazole (DEXILANT) 30 MG capsule DR Take 1 capsule (30 mg total) by mouth daily.   fluticasone (FLONASE) 50 MCG/ACT nasal spray Place 1 spray into both nostrils daily.   metoprolol succinate (TOPROL XL) 25 MG 24 hr tablet Take 1 tablet (25 mg total) by mouth daily.   nitroGLYCERIN (NITROSTAT) 0.4 MG SL tablet Place 1 tablet (0.4 mg total) under the tongue every 5 (five) minutes as needed for chest pain.   rosuvastatin (CRESTOR) 20 MG tablet Take 1 tablet (20 mg total) by mouth in the morning.   tamsulosin (FLOMAX) 0.4 MG CAPS capsule Take 0.4 mg by mouth daily.   [DISCONTINUED] Allopurinol 200 MG TABS Take 200 mg by mouth daily.   [DISCONTINUED] clopidogrel (PLAVIX) 75 MG tablet Take 1 tablet (75 mg total) by mouth daily.     Allergies:   Patient has no known allergies.   Social History   Socioeconomic History  Marital status: Married    Spouse name: Not on file   Number of children: Not on file   Years of education: Not on file   Highest education level: Not on file  Occupational History   Not on file  Tobacco Use   Smoking status: Never   Smokeless tobacco: Never  Substance and Sexual Activity   Alcohol use: Yes    Comment: few beers a night at least 4 night a week.   Drug use: No   Sexual activity: Yes  Other Topics Concern   Not on file  Social History Narrative   Not on file   Social Determinants of Health   Financial Resource Strain: Not on file  Food Insecurity: No  Food Insecurity (07/06/2022)   Hunger Vital Sign    Worried About Running Out of Food in the Last Year: Never true    Ran Out of Food in the Last Year: Never true  Transportation Needs: No Transportation Needs (07/06/2022)   PRAPARE - Administrator, Civil Service (Medical): No    Lack of Transportation (Non-Medical): No  Physical Activity: Not on file  Stress: Not on file  Social Connections: Not on file     Family History: The patient's family history includes Diabetes in his mother; Hypertension in his mother.  ROS:   Please see the history of present illness.     All other systems reviewed and are negative.  EKGs/Labs/Other Studies Reviewed:    The following studies were reviewed today:   EKG:    Recent Labs: 07/07/2022: ALT 111; BUN 11; Creatinine, Ser 1.14; Hemoglobin 14.2; Magnesium 2.1; Platelets 175; Potassium 3.9; Sodium 136  Recent Lipid Panel    Component Value Date/Time   CHOL 184 07/06/2022 1107   TRIG 133 07/06/2022 1107   HDL 57 07/06/2022 1107   CHOLHDL 3.2 07/06/2022 1107   VLDL 27 07/06/2022 1107   LDLCALC 100 (H) 07/06/2022 1107     Risk Assessment/Calculations:     Physical Exam:    VS:  Vitals:   10/22/22 1459  BP: 135/80  Pulse: 63     Wt Readings from Last 3 Encounters:  10/22/22 189 lb (85.7 kg)  07/30/22 190 lb 3.2 oz (86.3 kg)  07/22/22 185 lb (83.9 kg)     GEN:  Well nourished, well developed in no acute distress HEENT: Normal NECK: No JVD CARDIAC: RRR, no murmurs, rubs, gallops RESPIRATORY:  Clear to auscultation without rales, wheezing or rhonchi  ABDOMEN: Soft, non-tender, non-distended MUSCULOSKELETAL:  No edema; No deformity  SKIN: Warm and dry NEUROLOGIC:  Alert and oriented x 3 PSYCHIATRIC:  Normal affect   ASSESSMENT:    Ischemic heart disease: s/p PCI synergy DES mid and distal RCA. Normal LV function.  Had elevated Lp(a) - start metoprolol 25 mg XL daily [ if HR < 50 bpm or increased fatigue, he  can stop] - continue asa 81 mg daily - restart plavix 75 mg daily and continue until August for 6 total months [we discussed importance of taking] - continue crestor 20 mg daily; LDL goal < 70 mg /dL PLAN:    In order of problems listed above:  Fasting lipid profile Metop XL 25 mg daily Plavix total of 6 months Follow up in 6 months      Medication Adjustments/Labs and Tests Ordered: Current medicines are reviewed at length with the patient today.  Concerns regarding medicines are outlined above.  Orders Placed This Encounter  Procedures  Lipid panel   Meds ordered this encounter  Medications   metoprolol succinate (TOPROL XL) 25 MG 24 hr tablet    Sig: Take 1 tablet (25 mg total) by mouth daily.    Dispense:  90 tablet    Refill:  3   clopidogrel (PLAVIX) 75 MG tablet    Sig: Take 1 tablet (75 mg total) by mouth daily.    Dispense:  121 tablet    Refill:  0    Patient Instructions  Medication Instructions:  Metoprolol Succinate 25mg  daily Plavix 75mg  daily until 02/20/23 *If you need a refill on your cardiac medications before your next appointment, please call your pharmacy*   Lab Work: Lipid panel- fasting- Please return for Blood Work tomorrow. No appointment needed, lab here at the office is open Monday-Friday from 8AM to 4PM.   If you have labs (blood work) drawn today and your tests are completely normal, you will receive your results only by: MyChart Message (if you have MyChart) OR A paper copy in the mail If you have any lab test that is abnormal or we need to change your treatment, we will call you to review the results.   Follow-Up: At [facility], you and your health needs are our priority.  As part of our continuing mission to provide you with exceptional heart care, we have created designated Provider Care Teams.  These Care Teams include your primary Cardiologist (physician) and Advanced Practice Providers (APPs -  Physician Assistants and  Nurse Practitioners) who all work together to provide you with the care you need, when you need it.  We recommend signing up for the patient portal called "MyChart".  Sign up information is provided on this After Visit Summary.  MyChart is used to connect with patients for Virtual Visits (Telemedicine).  Patients are able to view lab/test results, encounter notes, upcoming appointments, etc.  Non-urgent messages can be sent to your provider as well.   To learn more about what you can do with MyChart, go to [email or web address].    Your next appointment:   6 month(s)  Provider:   [name], MD       Signed, [name], MD  10/22/2022 3:24 PM    [facility]

## 2022-10-22 NOTE — Patient Instructions (Signed)
Medication Instructions:  Metoprolol Succinate  daily Plavix  daily until 02/20/23 *If you need a refill on your cardiac medications before your next appointment, please call your pharmacy*   Lab Work: Lipid panel- fasting- Please return for Blood Work tomorrow. No appointment needed, lab here at the office is open Monday-Friday from 8AM to 4PM.   If you have labs (blood work) drawn today and your tests are completely normal, you will receive your results only by: MyChart Message (if you have MyChart) OR A paper copy in the mail If you have any lab test that is abnormal or we need to change your treatment, we will call you to review the results.   Follow-Up: At [facility], you and your health needs are our priority.  As part of our continuing mission to provide you with exceptional heart care, we have created designated Provider Care Teams.  These Care Teams include your primary Cardiologist (physician) and Advanced Practice Providers (APPs -  Physician Assistants and Nurse Practitioners) who all work together to provide you with the care you need, when you need it.  We recommend signing up for the patient portal called "MyChart".  Sign up information is provided on this After Visit Summary.  MyChart is used to connect with patients for Virtual Visits (Telemedicine).  Patients are able to view lab/test results, encounter notes, upcoming appointments, etc.  Non-urgent messages can be sent to your provider as well.   To learn more about what you can do with MyChart, go to [email or web address].    Your next appointment:   6 month(s)  Provider:   [name], MD

## 2022-10-24 DIAGNOSIS — Z9861 Coronary angioplasty status: Secondary | ICD-10-CM | POA: Diagnosis not present

## 2022-10-24 DIAGNOSIS — E785 Hyperlipidemia, unspecified: Secondary | ICD-10-CM | POA: Diagnosis not present

## 2022-10-24 DIAGNOSIS — I2510 Atherosclerotic heart disease of native coronary artery without angina pectoris: Secondary | ICD-10-CM | POA: Diagnosis not present

## 2022-10-25 LAB — LIPID PANEL
Chol/HDL Ratio: 4 ratio (ref 0.0–5.0)
Cholesterol, Total: 208 mg/dL — ABNORMAL HIGH (ref 100–199)
HDL: 52 mg/dL (ref >39)
LDL Chol Calc (NIH): 114 mg/dL — ABNORMAL HIGH (ref 0–99)
Triglycerides: 244 mg/dL — ABNORMAL HIGH (ref 0–149)
VLDL Cholesterol Cal: 42 mg/dL — ABNORMAL HIGH (ref 5–40)

## 2022-10-31 ENCOUNTER — Ambulatory Visit: Payer: BC Managed Care – PPO | Admitting: Internal Medicine

## 2022-11-14 ENCOUNTER — Telehealth: Payer: Self-pay | Admitting: Internal Medicine

## 2022-11-14 DIAGNOSIS — E782 Mixed hyperlipidemia: Secondary | ICD-10-CM

## 2022-11-14 MED ORDER — ROSUVASTATIN CALCIUM 40 MG PO TABS: 40.0000 mg | ORAL_TABLET | Freq: Every day | ORAL | 3 refills | Status: DC

## 2022-11-14 NOTE — Telephone Encounter (Signed)
Patient returned RN's call regarding results. 

## 2022-11-14 NOTE — Telephone Encounter (Signed)
Pt aware of lab results and agrees with plan ./cy     "bad cholesterol" is still elevated. Please increase his crestor to 40 mg daily with repeat fasting lipids in 3 months

## 2023-01-14 ENCOUNTER — Other Ambulatory Visit: Payer: Self-pay | Admitting: Internal Medicine

## 2023-02-02 DIAGNOSIS — H6591 Unspecified nonsuppurative otitis media, right ear: Secondary | ICD-10-CM | POA: Diagnosis not present

## 2023-02-02 DIAGNOSIS — H9191 Unspecified hearing loss, right ear: Secondary | ICD-10-CM | POA: Diagnosis not present

## 2023-02-09 DIAGNOSIS — H9041 Sensorineural hearing loss, unilateral, right ear, with unrestricted hearing on the contralateral side: Secondary | ICD-10-CM | POA: Diagnosis not present

## 2023-02-11 DIAGNOSIS — H9121 Sudden idiopathic hearing loss, right ear: Secondary | ICD-10-CM | POA: Diagnosis not present

## 2023-02-13 ENCOUNTER — Other Ambulatory Visit: Payer: Self-pay | Admitting: Otolaryngology

## 2023-02-13 DIAGNOSIS — H918X3 Other specified hearing loss, bilateral: Secondary | ICD-10-CM

## 2023-02-17 DIAGNOSIS — H9121 Sudden idiopathic hearing loss, right ear: Secondary | ICD-10-CM | POA: Diagnosis not present

## 2023-02-24 DIAGNOSIS — H9121 Sudden idiopathic hearing loss, right ear: Secondary | ICD-10-CM | POA: Diagnosis not present

## 2023-03-26 DIAGNOSIS — H9041 Sensorineural hearing loss, unilateral, right ear, with unrestricted hearing on the contralateral side: Secondary | ICD-10-CM | POA: Diagnosis not present

## 2023-03-26 DIAGNOSIS — H66011 Acute suppurative otitis media with spontaneous rupture of ear drum, right ear: Secondary | ICD-10-CM | POA: Diagnosis not present

## 2023-04-06 ENCOUNTER — Ambulatory Visit
Admission: RE | Admit: 2023-04-06 | Discharge: 2023-04-06 | Disposition: A | Discharge status: Home / Self Care | Payer: BC Managed Care – PPO | Source: Ambulatory Visit | Attending: Otolaryngology | Admitting: Otolaryngology

## 2023-04-06 DIAGNOSIS — H918X3 Other specified hearing loss, bilateral: Secondary | ICD-10-CM

## 2023-04-06 DIAGNOSIS — H905 Unspecified sensorineural hearing loss: Secondary | ICD-10-CM | POA: Diagnosis not present

## 2023-04-06 MED ORDER — GADOPICLENOL 0.5 MMOL/ML IV SOLN
7.5000 mL | Freq: Once | INTRAVENOUS | Status: AC | PRN
  Administered 2023-04-06: 7.5 mL via INTRAVENOUS

## 2023-04-10 ENCOUNTER — Telehealth (INDEPENDENT_AMBULATORY_CARE_PROVIDER_SITE_OTHER): Payer: Self-pay | Admitting: Otolaryngology

## 2023-04-17 ENCOUNTER — Encounter (INDEPENDENT_AMBULATORY_CARE_PROVIDER_SITE_OTHER): Payer: Self-pay

## 2023-04-17 ENCOUNTER — Ambulatory Visit (INDEPENDENT_AMBULATORY_CARE_PROVIDER_SITE_OTHER): Payer: BC Managed Care – PPO | Admitting: Otolaryngology

## 2023-04-17 VITALS — Ht 71.0 in | Wt 193.0 lb

## 2023-04-17 DIAGNOSIS — H9041 Sensorineural hearing loss, unilateral, right ear, with unrestricted hearing on the contralateral side: Secondary | ICD-10-CM | POA: Diagnosis not present

## 2023-04-17 DIAGNOSIS — H9391 Unspecified disorder of right ear: Secondary | ICD-10-CM | POA: Diagnosis not present

## 2023-04-17 DIAGNOSIS — H6521 Chronic serous otitis media, right ear: Secondary | ICD-10-CM

## 2023-04-17 DIAGNOSIS — H66011 Acute suppurative otitis media with spontaneous rupture of ear drum, right ear: Secondary | ICD-10-CM

## 2023-04-17 DIAGNOSIS — H7201 Central perforation of tympanic membrane, right ear: Secondary | ICD-10-CM

## 2023-04-19 DIAGNOSIS — H9041 Sensorineural hearing loss, unilateral, right ear, with unrestricted hearing on the contralateral side: Secondary | ICD-10-CM | POA: Insufficient documentation

## 2023-04-19 DIAGNOSIS — H7201 Central perforation of tympanic membrane, right ear: Secondary | ICD-10-CM | POA: Insufficient documentation

## 2023-04-19 DIAGNOSIS — H6521 Chronic serous otitis media, right ear: Secondary | ICD-10-CM | POA: Insufficient documentation

## 2023-04-19 DIAGNOSIS — H66011 Acute suppurative otitis media with spontaneous rupture of ear drum, right ear: Secondary | ICD-10-CM | POA: Insufficient documentation

## 2023-04-19 NOTE — Progress Notes (Signed)
Patient ID: Steven Huber, male   DOB: 12/19/1960, 62 y.o.   MRN: [id number]  Follow-up: Right ear hearing loss, right middle ear effusion, right ear drainage  HPI: The patient is a 62 year old male who returns today for his follow-up evaluation.  The patient was previously seen for sudden right ear hearing loss.  He was noted to have asymmetric profound right ear sensorineural hearing loss.  He was treated with systemic and topical steroids, including chemical labyrinthotomy with dexamethasone infusion.  Despite the treatment, he continues to have right ear profound hearing loss.  The patient subsequently developed right middle ear effusion and right ear drainage.  He was treated with Ciprodex eardrops and CSF powder.  He also underwent an MRI scan.  The MRI showed no retrocochlear lesion.  The right middle ear space and the right mastoid cavity were noted to be opacified.  The patient returns today reporting resolution of his right ear drainage.  He denies any otalgia, vertigo, or change in his hearing.  Exam: General: Communicates without difficulty, well nourished, no acute distress. Head: Normocephalic, no evidence injury, no tenderness, facial buttresses intact without stepoff. Face/sinus: No tenderness to palpation and percussion. Facial movement is normal and symmetric. Eyes: PERRL, EOMI. No scleral icterus, conjunctivae clear. Neuro: CN II exam reveals vision grossly intact.  No nystagmus at any point of gaze. Ears: Auricles well formed without lesions.  Debrides and crusting are noted within the right ear canal.  Under the operating microscope, the right ear canal is debrided with a suction catheter.  The right tube is in place and patent.  The left tympanic membrane and middle ear space are noted to be normal.  Nose: External evaluation reveals normal support and skin without lesions.  Dorsum is intact.  Anterior rhinoscopy reveals congested mucosa over anterior aspect of inferior turbinates and  intact septum.  No purulence noted. Oral:  Oral cavity and oropharynx are intact, symmetric, without erythema or edema.  Mucosa is moist without lesions. Neck: Full range of motion without pain.  There is no significant lymphadenopathy.  No masses palpable.  Thyroid bed within normal limits to palpation.  Parotid glands and submandibular glands equal bilaterally without mass.  Trachea is midline. Neuro:  CN 2-12 grossly intact.   Assessment: 1.  The patient's right middle ear effusion and right ear drainage have resolved. 2.  The right tube is in place and patent. 3.  Persistent asymmetric severe to profound right ear sensorineural hearing loss. 4.  Crusting and debris's are noted within the right ear canal. 5.  His MRI scan did not show any retrocochlear lesion.  Plan: 1.  The physical exam findings are reviewed with the patient.  The MRI results are also reviewed. 2.  Otomicroscopy with debridement of the right ear canal. 3.  Continue dry ear precaution on the right side. 4.  The patient is a candidate for a CROS hearing aid. 5.  The patient will return for reevaluation in 6 months.

## 2023-05-28 ENCOUNTER — Encounter: Payer: Self-pay | Admitting: Internal Medicine

## 2023-05-28 ENCOUNTER — Ambulatory Visit: Payer: BC Managed Care – PPO | Attending: Internal Medicine | Admitting: Internal Medicine

## 2023-05-28 VITALS — BP 132/68 | HR 60 | Ht 71.0 in | Wt 191.0 lb

## 2023-05-28 DIAGNOSIS — E785 Hyperlipidemia, unspecified: Secondary | ICD-10-CM | POA: Diagnosis not present

## 2023-05-28 DIAGNOSIS — I10 Essential (primary) hypertension: Secondary | ICD-10-CM | POA: Diagnosis not present

## 2023-05-28 MED ORDER — EZETIMIBE 10 MG PO TABS: 10.0000 mg | ORAL_TABLET | Freq: Every day | ORAL | 3 refills | Status: AC

## 2023-05-28 NOTE — Progress Notes (Signed)
Cardiology Office Note:    Date:  05/28/2023   ID:  Steven Huber, DOB 23-Mar-1961, MRN [id number]  PCP:  [name], MD   [facility] Providers Cardiologist:  [name], MD Cardiology APP:  [name], [place]     Referring MD: [name], MD   No chief complaint on file. CAD, Hospital FU  History of Present Illness:    Steven Huber is a 62 y.o. male with a hx of GERD, CAD here for FU.  He was admitted 07/22/2022 to [facility]. presented with CP, c/f angina CCS II. He was hospitalized 07/05/22-07/07/22. Troponin elevation was mild and flat, inconsistent with ACS. His risk included HLD, age and his story of exertional CP/jaw pain.He was started on heparin initially based on his story.  No ischemic EKG. His echo was normal. Normal LVEF/RV function, no significant valve disease.  No pulmonary HTN. We discussed that he does not have high risk ACS. Will plan for an outpatient coronary CT scan. OP coronary CTA showed plaque in all three vessels, negative FFR in LAD and LCX, FFR could not be completed in the RCA and cardiac catheterization was recommended.The patient underwent cardiac cath as noted above with 90% distal RCA treated with 3.0 x 12 mm DES, mid RCA 80% lesion treated with 3.0 x 16 mm. Plan for DAPT with ASA/plavix. Normal renal function.    Today he reports he has been doing well. He's been off plavix for a month, states prescription ran out. He notes some stomach upset with medications. He is exercising more. He likes to run. He runs on the treadmill.  He denies CP or SOB.    Interm hx 05/28/2023 He denies CP or SOB. He runs on a treadmill and does steam and exercises 4x per week.    Current Medications: Current Outpatient Medications on File Prior to Visit  Medication Sig Dispense Refill   aspirin EC 81 MG tablet Take 1 tablet (81 mg total) by mouth daily. Swallow whole. 30 tablet 5   clopidogrel (PLAVIX) 75 MG tablet TAKE 1 TABLET BY MOUTH EVERY  DAY 90 tablet 2   Dexlansoprazole (DEXILANT) 30 MG capsule DR Take 1 capsule (30 mg total) by mouth daily. 90 capsule 3   fluticasone (FLONASE) 50 MCG/ACT nasal spray Place 1 spray into both nostrils daily.     nitroGLYCERIN (NITROSTAT) 0.4 MG SL tablet Place 1 tablet (0.4 mg total) under the tongue every 5 (five) minutes as needed for chest pain. 25 tablet 3   metoprolol succinate (TOPROL XL) 25 MG 24 hr tablet Take 1 tablet (25 mg total) by mouth daily. (Patient not taking: Reported on 05/28/2023) 90 tablet 3   rosuvastatin (CRESTOR) 40 MG tablet Take 1 tablet (40 mg total) by mouth daily. 90 tablet 3   tamsulosin (FLOMAX) 0.4 MG CAPS capsule Take 0.4 mg by mouth daily. (Patient not taking: Reported on 05/28/2023)     No current facility-administered medications on file prior to visit.     Allergies:   Patient has no known allergies.  Family History: The patient's family history includes Diabetes in his mother; Hypertension in his mother.  ROS:   Please see the history of present illness.     All other systems reviewed and are negative.  EKGs/Labs/Other Studies Reviewed:    The following studies were reviewed today:   EKG:   EKG Interpretation Date/Time:  Thursday May 28 2023 11:50:08 EST Ventricular Rate:  60 PR Interval:  228 QRS Duration:  86 QT Interval:  416 QTC Calculation: 416 R Axis:   77  Text Interpretation: Sinus rhythm with 1st degree A-V block Cannot rule out Inferior infarct (cited on or before 22-Jul-2022) When compared with ECG of 22-Jul-2022 09:35, Nonspecific T wave abnormality now evident in Inferior leads Confirmed by Carolan Clines [phone]) on 05/28/2023 3:07:24 PM    Recent Labs: 07/07/2022: ALT 111; BUN 11; Creatinine, Ser 1.14; Hemoglobin 14.2; Magnesium 2.1; Platelets 175; Potassium 3.9; Sodium 136    Recent Lipid Panel    Component Value Date/Time   CHOL 208 (H) 10/24/2022 0903   TRIG 244 (H) 10/24/2022 0903   HDL 52 10/24/2022 0903   CHOLHDL  4.0 10/24/2022 0903   CHOLHDL 3.2 07/06/2022 1107   VLDL 27 07/06/2022 1107   LDLCALC 114 (H) 10/24/2022 0903     Risk Assessment/Calculations:     Physical Exam:    VS:  Vitals:   05/28/23 1146  BP: 132/68  Pulse: 60  SpO2: 96%     Wt Readings from Last 3 Encounters:  04/17/23 193 lb (87.5 kg)  10/22/22 189 lb (85.7 kg)  07/30/22 190 lb 3.2 oz (86.3 kg)     GEN:  Well nourished, well developed in no acute distress HEENT: Normal NECK: No JVD CARDIAC: RRR, no murmurs, rubs, gallops RESPIRATORY:  Clear to auscultation without rales, wheezing or rhonchi  ABDOMEN: Soft, non-tender, non-distended MUSCULOSKELETAL:  No edema; No deformity  SKIN: Warm and dry NEUROLOGIC:  Alert and oriented x 3 PSYCHIATRIC:  Normal affect   ASSESSMENT:    Ischemic heart disease: s/p PCI synergy DES mid and distal RCA.  Status post DAPT -Normal LV function.  -We discussed his metoprolol again today and his heart rates are still on the lower side and he is not able to augment it very well. - continue asa 81 mg daily - Lp(a) elevated - continue crestor 40 mg daily and zetia 10 mg daily; LDL goal < 70 mg /dL; fasting lipid in 3 months PLAN:    In order of problems listed above:    Stop BB Start Zetia and plan for follow-up lipids in 3 months Follow up in 1 year      Medication Adjustments/Labs and Tests Ordered: Current medicines are reviewed at length with the patient today.  Concerns regarding medicines are outlined above.  No orders of the defined types were placed in this encounter.  No orders of the defined types were placed in this encounter.   There are no Patient Instructions on file for this visit.   Signed, [name], MD  05/28/2023 10:07 AM    [facility]

## 2023-05-28 NOTE — Patient Instructions (Signed)
Medication Instructions:  Start Zetia 10 mg daily Stop the metoprolol  *If you need a refill on your cardiac medications before your next appointment, please call your pharmacy*   Lab Work: Lipid panel in 3 months fasting  If you have labs (blood work) drawn today and your tests are completely normal, you will receive your results only by: MyChart Message (if you have MyChart) OR A paper copy in the mail If you have any lab test that is abnormal or we need to change your treatment, we will call you to review the results.   Testing/Procedures: None    Follow-Up: At [facility], you and your health needs are our priority.  As part of our continuing mission to provide you with exceptional heart care, we have created designated Provider Care Teams.  These Care Teams include your primary Cardiologist (physician) and Advanced Practice Providers (APPs -  Physician Assistants and Nurse Practitioners) who all work together to provide you with the care you need, when you need it.  1 year(s)  Provider:   [name], MD

## 2023-07-17 ENCOUNTER — Other Ambulatory Visit: Payer: Self-pay | Admitting: Internal Medicine

## 2023-07-17 DIAGNOSIS — K219 Gastro-esophageal reflux disease without esophagitis: Secondary | ICD-10-CM

## 2023-07-17 DIAGNOSIS — E785 Hyperlipidemia, unspecified: Secondary | ICD-10-CM

## 2023-08-02 DIAGNOSIS — R0981 Nasal congestion: Secondary | ICD-10-CM | POA: Diagnosis not present

## 2023-08-02 DIAGNOSIS — J101 Influenza due to other identified influenza virus with other respiratory manifestations: Secondary | ICD-10-CM | POA: Diagnosis not present

## 2023-09-28 DIAGNOSIS — Z9622 Myringotomy tube(s) status: Secondary | ICD-10-CM | POA: Diagnosis not present

## 2023-09-28 DIAGNOSIS — H90A31 Mixed conductive and sensorineural hearing loss, unilateral, right ear with restricted hearing on the contralateral side: Secondary | ICD-10-CM | POA: Diagnosis not present

## 2023-09-28 DIAGNOSIS — H903 Sensorineural hearing loss, bilateral: Secondary | ICD-10-CM | POA: Diagnosis not present

## 2023-10-09 ENCOUNTER — Other Ambulatory Visit: Payer: Self-pay | Admitting: Internal Medicine

## 2023-10-16 ENCOUNTER — Ambulatory Visit (INDEPENDENT_AMBULATORY_CARE_PROVIDER_SITE_OTHER): Payer: BC Managed Care – PPO

## 2024-01-12 ENCOUNTER — Other Ambulatory Visit: Payer: Self-pay

## 2024-01-12 DIAGNOSIS — E782 Mixed hyperlipidemia: Secondary | ICD-10-CM

## 2024-01-12 MED ORDER — ROSUVASTATIN CALCIUM 40 MG PO TABS: 40.0000 mg | ORAL_TABLET | Freq: Every day | ORAL | 1 refills | Status: DC

## 2024-03-08 DIAGNOSIS — H90A31 Mixed conductive and sensorineural hearing loss, unilateral, right ear with restricted hearing on the contralateral side: Secondary | ICD-10-CM | POA: Diagnosis not present

## 2024-03-29 ENCOUNTER — Encounter (INDEPENDENT_AMBULATORY_CARE_PROVIDER_SITE_OTHER): Payer: Self-pay | Admitting: Otolaryngology

## 2024-03-29 ENCOUNTER — Ambulatory Visit (INDEPENDENT_AMBULATORY_CARE_PROVIDER_SITE_OTHER): Admitting: Otolaryngology

## 2024-03-29 VITALS — BP 134/77 | HR 56 | Temp 98.0°F | Ht 71.0 in | Wt 185.0 lb

## 2024-03-29 DIAGNOSIS — H6691 Otitis media, unspecified, right ear: Secondary | ICD-10-CM

## 2024-03-29 DIAGNOSIS — H9041 Sensorineural hearing loss, unilateral, right ear, with unrestricted hearing on the contralateral side: Secondary | ICD-10-CM

## 2024-03-29 DIAGNOSIS — H66014 Acute suppurative otitis media with spontaneous rupture of ear drum, recurrent, right ear: Secondary | ICD-10-CM

## 2024-03-29 NOTE — Telephone Encounter (Signed)
 error

## 2024-03-30 NOTE — Progress Notes (Signed)
 Patient ID: Steven Huber, male   DOB: April 14, 1961, 63 y.o.   MRN: [id number]  CC: Right ear pain and drainage, hearing loss  HPI: The patient is a 63 year old male who presents today complaining of right ear pain for the past 5 days.  The right ear pain started after he jumped into a pool without ear protection.  The patient has a history of sudden onset asymmetric profound right ear sensorineural hearing loss.  He was treated with systemic and topical steroids, including chemical labyrinthotomy with dexamethasone infusion.  Despite the treatment, he continued to have right ear profound hearing loss.  The patient subsequently developed right middle ear effusion and right ear drainage.  He was treated with Ciprodex eardrops and CSF powder.  He also underwent an MRI scan.  The MRI showed no retrocochlear lesion.  The patient returns today reporting that he was doing well until 5 days ago, when he jumped into a pool without earplug.  The otalgia subsequently led to right ear drainage.  He is not on any otologic medication at this time.  Exam: General: Communicates without difficulty, well nourished, no acute distress. Head: Normocephalic, no evidence injury, no tenderness, facial buttresses intact without stepoff. Face/sinus: No tenderness to palpation and percussion. Facial movement is normal and symmetric. Eyes: PERRL, EOMI. No scleral icterus, conjunctivae clear. Neuro: CN II exam reveals vision grossly intact.  No nystagmus at any point of gaze. Ears: Auricles well formed without lesions.  The left ear canal and tympanic membrane are normal.  Purulent drainage is noted within the right ear canal.  Under the operating microscope, the right ear canal is debrided with a suction catheter.  The right tube is in place and patent.  CSF powder is applied.  Nose: External evaluation reveals normal support and skin without lesions.  Dorsum is intact.  Anterior rhinoscopy reveals normal mucosa over anterior aspect of  inferior turbinates and intact septum.  No purulence noted. Oral:  Oral cavity and oropharynx are intact, symmetric, without erythema or edema.  Mucosa is moist without lesions. Neck: Full range of motion without pain.  There is no significant lymphadenopathy.  No masses palpable.  Thyroid bed within normal limits to palpation.  Parotid glands and submandibular glands equal bilaterally without mass.  Trachea is midline. Neuro:  CN 2-12 grossly intact.   Assessment: 1.  Right acute otitis media with purulent otorrhea.  The right tube is in place and patent. 2.  The left ear canal and tympanic membrane are normal.  Plan: 1.  The physical exam findings are reviewed with the patient. 2.  Otomicroscopy with debridement of the right ear canal. 3.  CSF powder to be applied to the right ear daily for 1 week. 4.  The patient will return for reevaluation if he continues to be symptomatic.

## 2024-04-05 DIAGNOSIS — H90A21 Sensorineural hearing loss, unilateral, right ear, with restricted hearing on the contralateral side: Secondary | ICD-10-CM | POA: Diagnosis not present

## 2024-07-04 ENCOUNTER — Other Ambulatory Visit: Payer: Self-pay

## 2024-07-04 DIAGNOSIS — E782 Mixed hyperlipidemia: Secondary | ICD-10-CM

## 2024-07-06 MED ORDER — ROSUVASTATIN CALCIUM 40 MG PO TABS: 40.0000 mg | ORAL_TABLET | Freq: Every day | ORAL | 0 refills | Status: DC

## 2024-07-08 ENCOUNTER — Other Ambulatory Visit: Payer: Self-pay | Admitting: Physician Assistant

## 2024-07-08 DIAGNOSIS — E782 Mixed hyperlipidemia: Secondary | ICD-10-CM

## 2024-07-08 MED ORDER — ROSUVASTATIN CALCIUM 40 MG PO TABS: 40.0000 mg | ORAL_TABLET | Freq: Every day | ORAL | 0 refills | Status: AC

## 2024-07-13 ENCOUNTER — Ambulatory Visit: Attending: Cardiovascular Disease | Admitting: Internal Medicine

## 2024-07-13 ENCOUNTER — Encounter: Payer: Self-pay | Admitting: Internal Medicine

## 2024-07-13 VITALS — BP 122/74 | HR 55 | Ht 71.0 in | Wt 193.2 lb

## 2024-07-13 DIAGNOSIS — I2510 Atherosclerotic heart disease of native coronary artery without angina pectoris: Secondary | ICD-10-CM | POA: Diagnosis not present

## 2024-07-13 DIAGNOSIS — Z79899 Other long term (current) drug therapy: Secondary | ICD-10-CM

## 2024-07-13 DIAGNOSIS — E782 Mixed hyperlipidemia: Secondary | ICD-10-CM | POA: Diagnosis not present

## 2024-07-13 DIAGNOSIS — Z9861 Coronary angioplasty status: Secondary | ICD-10-CM | POA: Diagnosis not present

## 2024-07-13 NOTE — Progress Notes (Signed)
 " Cardiology Office Note:  .   Date:  07/13/2024  ID:  Steven Huber, DOB 11-15-1960, MRN [id number] PCP: Rolinda Millman, MD  [facility] Providers Cardiologist:  Alvan Ronal BRAVO, MD (Inactive) Cardiology APP:  Madie Jon Garre, PA    History of Present Illness: .    Steven Huber is a 64 y.o. male who is a former patient of Dr. Alvan with a past medical history of GERD, CAD s/p DES x 2 to RCA in January 2024, elevated LP(a), hyperlipidemia who presents today for annual follow-up.  Discussed the use of AI scribe software for clinical note transcription with the patient, who gave verbal consent to proceed.  History of Present Illness Steven Huber is a 64 year old male with coronary artery disease who presents for an annual follow-up.  In December 2023, he experienced exertional chest pain and jaw pain, leading to hospitalization with mildly elevated troponin levels. A coronary CT scan showed triple vessel coronary disease, and a subsequent cardiac catheterization revealed severe RCA stenosis, which was treated with stents. He was initially started on heparin , but no ischemic changes were noted on EKG, and echocardiogram findings were normal.  He has a history of coronary artery disease and underwent cardiac catheterization in March 2024, where a 90% distal RCA stenosis and an 80% mid RCA lesion were treated with drug-eluting stents. Initially, he was on dual antiplatelet therapy with aspirin  and Plavix , but Plavix  was discontinued after a year. He continues on aspirin , rosuvastatin , and Zetia . No current issues with chest pain, shortness of breath, or exercise intolerance.  He has a history of elevated lipoprotein A. His father died of a stroke, and there is a family history of hereditary cholesterol issues.  He maintains a regimen of regular exercise, attempts to eat a healthier diet, and has reduced alcohol consumption. He monitors his blood pressure at home and has noted  it to be slightly elevated, for which he has been taking a vitamin regimen. He is not currently on a beta blocker due to his already low heart rate.  He is currently taking aspirin  81 mg, rosuvastatin  40 mg, metoprolol  succinate 25 mg, and Zetia  10 mg. He also uses Flonase  for allergies.      ROS: Remaining review of systems negative  Studies Reviewed: .        Results Labs Troponin (07/05/2022): Mildly elevated Lipoprotein(a) (2023): 104  Radiology Coronary CTA (07/16/2022): Coronary calcium  score 789, normal coronary origin with right dominance, proximal to mid LAD mild to moderate plaque 40-50%, moderate mixed density plaque in mid left circumflex 50-69%, severe serial stenosis of mid and distal RCA 99% stenosis, LAD and left circumflex FFR negative, RCA FFR not completed  Diagnostic EKG (07/05/2022): No ischemic changes Echocardiogram (07/06/2022): Ejection fraction 60-65%, no regional wall motion abnormalities, mild left ventricular hypertrophy, normal diastolic function, no hemodynamically significant valvular abnormalities, mildly dilated aortic root at 38 mm Left heart catheterization (07/22/2022): Severe single vessel disease, 80% mid RCA stenosis, 90% ulcerative plaque just proximal to bifurcation of PDL and PLA, proximal to mid RCA 30% and 40% stenosis, RCA/PDS x2 to mid and distal lesion, diffuse mild to moderate disease throughout LAD, relatively normal circumflex except for ostial circumflex after OM Risk Assessment/Calculations:             Physical Exam:   VS:  BP 122/74   Pulse (!) 55   Ht 5' 11 (1.803 m)   Wt 193 lb 3.2 oz (87.6  kg)   SpO2 98%   BMI 26.95 kg/m    Wt Readings from Last 3 Encounters:  07/13/24 193 lb 3.2 oz (87.6 kg)  03/29/24 185 lb (83.9 kg)  05/28/23 191 lb (86.6 kg)    GEN: Well nourished, well developed in no acute distress NECK: No JVD; No carotid bruits CARDIAC:  RRR, no murmurs, no rubs, no gallops RESPIRATORY:  Clear to  auscultation without rales, wheezing or rhonchi  ABDOMEN: Soft, non-tender, non-distended EXTREMITIES:  No edema; No deformity   ASSESSMENT AND PLAN: .    Assessment and Plan Assessment & Plan Premature coronary artery disease s/p DES x 2 to RCA in 2024 Stable and without angina.  Completed DAPT and currently on aspirin  81 mg for antiplatelet therapy and high-intensity statin.  Has an elevated lipoprotein a.  Not on beta blocker due to bradycardia.  - Ordered lipid panel to assess current cholesterol levels. - Continue aspirin  therapy. - Continue rosuvastatin  and Zetia . - Consider increasing statin if LDL is not at goal.  Hyperlipidemia with elevated lipoprotein(a) Elevated lipoprotein(a) at 104, a genetic coronary disease marker.  - Focus on controlling other cardiovascular risk factors. - Will place a referral to lipid clinic - Ensure good control of blood pressure and cholesterol, diet and exercise.  Hypertension Managed with lifestyle modifications. - Continue lifestyle modifications for blood pressure control. - Blood pressure goal less than 130/80, currently at goal            Follow up: 1 year  Signed, Emeline FORBES Calender, DO  07/13/2024 11:56 AM    [facility] "

## 2024-07-13 NOTE — Patient Instructions (Signed)
 Medication Instructions:  Your physician recommends that you continue on your current medications as directed. Please refer to the Current Medication list given to you today.  *If you need a refill on your cardiac medications before your next appointment, please call your pharmacy*  Lab Work: Please complete a FASTING lipid panel as soon as you can at any  [facility].  If you have labs (blood work) drawn today and your tests are completely normal, you will receive your results only by: MyChart Message (if you have MyChart) OR A paper copy in the mail If you have any lab test that is abnormal or we need to change your treatment, we will call you to review the results.  Testing/Procedures: None.  Follow-Up: At [facility], you and your health needs are our priority.  As part of our continuing mission to provide you with exceptional heart care, our providers are all part of one team.  This team includes your primary Cardiologist (physician) and Advanced Practice Providers or APPs (Physician Assistants and Nurse Practitioners) who all work together to provide you with the care you need, when you need it.  Your next appointment:   1 year(s)  Provider:   Dr. Emeline Calender, DO   We recommend signing up for the patient portal called MyChart.  Sign up information is provided on this After Visit Summary.  MyChart is used to connect with patients for Virtual Visits (Telemedicine).  Patients are able to view lab/test results, encounter notes, upcoming appointments, etc.  Non-urgent messages can be sent to your provider as well.   To learn more about what you can do with MyChart, go to [email or web address].   Other Instructions Dr. Calender you be referred to our lipid clinic. This is run by our pharmacists. They look into what you've been on before, what side effects you've had, even costs of medications and insurance coverage. The goal is to get you on medication or a combo of medications  that control your cholesterol with a minimum of side effects. Someone will call you to set up an appointment.

## 2024-07-14 ENCOUNTER — Ambulatory Visit: Payer: Self-pay | Admitting: Internal Medicine

## 2024-07-14 LAB — LIPID PANEL
Chol/HDL Ratio: 2.4 ratio (ref 0.0–5.0)
Cholesterol, Total: 134 mg/dL (ref 100–199)
HDL: 56 mg/dL
LDL Chol Calc (NIH): 62 mg/dL (ref 0–99)
Triglycerides: 85 mg/dL (ref 0–149)
VLDL Cholesterol Cal: 16 mg/dL (ref 5–40)

## 2024-08-29 ENCOUNTER — Ambulatory Visit: Admitting: Pharmacist Clinician (PhC)/ Clinical Pharmacy Specialist
